# Patient Record
Sex: Female | Born: 1980
Health system: Southern US, Community
[De-identification: ages and names within clinical notes are randomized; demographics above are authoritative.]

## PROBLEM LIST (undated history)

## (undated) DIAGNOSIS — J9819 Other pulmonary collapse: Secondary | ICD-10-CM

## (undated) DIAGNOSIS — F419 Anxiety disorder, unspecified: Secondary | ICD-10-CM

## (undated) DIAGNOSIS — F410 Panic disorder [episodic paroxysmal anxiety] without agoraphobia: Secondary | ICD-10-CM

## (undated) DIAGNOSIS — T7840XA Allergy, unspecified, initial encounter: Secondary | ICD-10-CM

## (undated) DIAGNOSIS — R011 Cardiac murmur, unspecified: Secondary | ICD-10-CM

## (undated) DIAGNOSIS — I509 Heart failure, unspecified: Secondary | ICD-10-CM

## (undated) DIAGNOSIS — F329 Major depressive disorder, single episode, unspecified: Secondary | ICD-10-CM

## (undated) DIAGNOSIS — M419 Scoliosis, unspecified: Secondary | ICD-10-CM

## (undated) DIAGNOSIS — G709 Myoneural disorder, unspecified: Secondary | ICD-10-CM

## (undated) DIAGNOSIS — D649 Anemia, unspecified: Secondary | ICD-10-CM

## (undated) DIAGNOSIS — F32A Depression, unspecified: Secondary | ICD-10-CM

## (undated) DIAGNOSIS — IMO0002 Reserved for concepts with insufficient information to code with codable children: Secondary | ICD-10-CM

## (undated) HISTORY — DX: Heart failure, unspecified: I50.9

## (undated) HISTORY — DX: Allergy, unspecified, initial encounter: T78.40XA

## (undated) HISTORY — DX: Reserved for concepts with insufficient information to code with codable children: IMO0002

## (undated) HISTORY — DX: Cardiac murmur, unspecified: R01.1

## (undated) HISTORY — DX: Anemia, unspecified: D64.9

## (undated) HISTORY — DX: Panic disorder (episodic paroxysmal anxiety): F41.0

## (undated) HISTORY — DX: Major depressive disorder, single episode, unspecified: F32.9

## (undated) HISTORY — DX: Depression, unspecified: F32.A

## (undated) HISTORY — DX: Myoneural disorder, unspecified: G70.9

## (undated) HISTORY — DX: Scoliosis, unspecified: M41.9

## (undated) HISTORY — DX: Anxiety disorder, unspecified: F41.9

---

## 1998-11-09 ENCOUNTER — Other Ambulatory Visit: Admission: RE | Admit: 1998-11-09 | Discharge: 1998-11-09 | Payer: Self-pay | Admitting: Obstetrics and Gynecology

## 1998-12-16 ENCOUNTER — Inpatient Hospital Stay (HOSPITAL_COMMUNITY): Admission: AD | Admit: 1998-12-16 | Discharge: 1998-12-16 | Payer: Self-pay | Admitting: Obstetrics and Gynecology

## 1999-03-23 ENCOUNTER — Encounter: Payer: Self-pay | Admitting: Gynecology

## 1999-03-23 ENCOUNTER — Inpatient Hospital Stay (HOSPITAL_COMMUNITY): Admission: AD | Admit: 1999-03-23 | Discharge: 1999-03-25 | Payer: Self-pay | Admitting: Gynecology

## 1999-03-29 ENCOUNTER — Inpatient Hospital Stay (HOSPITAL_COMMUNITY): Admission: AD | Admit: 1999-03-29 | Discharge: 1999-04-02 | Payer: Self-pay | Admitting: Obstetrics and Gynecology

## 1999-04-17 ENCOUNTER — Inpatient Hospital Stay (HOSPITAL_COMMUNITY): Admission: AD | Admit: 1999-04-17 | Discharge: 1999-04-21 | Payer: Self-pay | Admitting: Gynecology

## 1999-05-02 ENCOUNTER — Inpatient Hospital Stay (HOSPITAL_COMMUNITY): Admission: AD | Admit: 1999-05-02 | Discharge: 1999-05-06 | Payer: Self-pay | Admitting: Gynecology

## 1999-05-03 ENCOUNTER — Encounter: Payer: Self-pay | Admitting: Gynecology

## 2000-01-24 ENCOUNTER — Other Ambulatory Visit: Admission: RE | Admit: 2000-01-24 | Discharge: 2000-01-24 | Payer: Self-pay | Admitting: Gynecology

## 2000-03-04 ENCOUNTER — Inpatient Hospital Stay (HOSPITAL_COMMUNITY): Admission: AD | Admit: 2000-03-04 | Discharge: 2000-03-04 | Payer: Self-pay | Admitting: Gynecology

## 2000-04-20 ENCOUNTER — Inpatient Hospital Stay (HOSPITAL_COMMUNITY): Admission: AD | Admit: 2000-04-20 | Discharge: 2000-04-20 | Payer: Self-pay | Admitting: *Deleted

## 2000-04-22 ENCOUNTER — Encounter (INDEPENDENT_AMBULATORY_CARE_PROVIDER_SITE_OTHER): Payer: Self-pay | Admitting: Specialist

## 2000-04-22 ENCOUNTER — Inpatient Hospital Stay (HOSPITAL_COMMUNITY): Admission: AD | Admit: 2000-04-22 | Discharge: 2000-04-25 | Payer: Self-pay | Admitting: Gynecology

## 2000-05-31 ENCOUNTER — Other Ambulatory Visit: Admission: RE | Admit: 2000-05-31 | Discharge: 2000-05-31 | Payer: Self-pay | Admitting: Gynecology

## 2000-06-04 ENCOUNTER — Inpatient Hospital Stay (HOSPITAL_COMMUNITY): Admission: AD | Admit: 2000-06-04 | Discharge: 2000-06-04 | Payer: Self-pay | Admitting: *Deleted

## 2000-07-16 ENCOUNTER — Encounter (INDEPENDENT_AMBULATORY_CARE_PROVIDER_SITE_OTHER): Payer: Self-pay

## 2000-07-16 ENCOUNTER — Other Ambulatory Visit: Admission: RE | Admit: 2000-07-16 | Discharge: 2000-07-16 | Payer: Self-pay | Admitting: Gynecology

## 2000-08-21 ENCOUNTER — Ambulatory Visit (HOSPITAL_COMMUNITY): Admission: RE | Admit: 2000-08-21 | Discharge: 2000-08-21 | Payer: Self-pay | Admitting: Gynecology

## 2000-12-04 ENCOUNTER — Other Ambulatory Visit: Admission: RE | Admit: 2000-12-04 | Discharge: 2000-12-04 | Payer: Self-pay | Admitting: Gynecology

## 2001-12-30 ENCOUNTER — Encounter: Payer: Self-pay | Admitting: Emergency Medicine

## 2001-12-30 ENCOUNTER — Emergency Department (HOSPITAL_COMMUNITY): Admission: EM | Admit: 2001-12-30 | Discharge: 2001-12-30 | Payer: Self-pay | Admitting: Emergency Medicine

## 2003-09-15 ENCOUNTER — Emergency Department (HOSPITAL_COMMUNITY): Admission: EM | Admit: 2003-09-15 | Discharge: 2003-09-16 | Payer: Self-pay | Admitting: Emergency Medicine

## 2003-09-16 ENCOUNTER — Encounter: Payer: Self-pay | Admitting: Emergency Medicine

## 2003-11-08 ENCOUNTER — Other Ambulatory Visit: Admission: RE | Admit: 2003-11-08 | Discharge: 2003-11-08 | Payer: Self-pay | Admitting: Obstetrics and Gynecology

## 2003-11-27 ENCOUNTER — Emergency Department (HOSPITAL_COMMUNITY): Admission: AD | Admit: 2003-11-27 | Discharge: 2003-11-27 | Payer: Self-pay | Admitting: Family Medicine

## 2006-02-04 ENCOUNTER — Emergency Department (HOSPITAL_COMMUNITY): Admission: EM | Admit: 2006-02-04 | Discharge: 2006-02-04 | Payer: Self-pay | Admitting: Emergency Medicine

## 2006-02-06 ENCOUNTER — Emergency Department (HOSPITAL_COMMUNITY): Admission: EM | Admit: 2006-02-06 | Discharge: 2006-02-06 | Payer: Self-pay | Admitting: Emergency Medicine

## 2007-01-05 ENCOUNTER — Inpatient Hospital Stay (HOSPITAL_COMMUNITY): Admission: AD | Admit: 2007-01-05 | Discharge: 2007-01-05 | Payer: Self-pay | Admitting: Obstetrics and Gynecology

## 2007-05-18 ENCOUNTER — Inpatient Hospital Stay (HOSPITAL_COMMUNITY): Admission: AD | Admit: 2007-05-18 | Discharge: 2007-05-21 | Payer: Self-pay | Admitting: Obstetrics and Gynecology

## 2008-04-12 ENCOUNTER — Emergency Department (HOSPITAL_COMMUNITY): Admission: EM | Admit: 2008-04-12 | Discharge: 2008-04-12 | Payer: Self-pay | Admitting: Family Medicine

## 2010-10-17 ENCOUNTER — Inpatient Hospital Stay (HOSPITAL_COMMUNITY): Admission: AD | Admit: 2010-10-17 | Discharge: 2010-10-17 | Payer: Self-pay | Admitting: Obstetrics & Gynecology

## 2011-03-14 LAB — URINALYSIS, ROUTINE W REFLEX MICROSCOPIC
Bilirubin Urine: NEGATIVE
Glucose, UA: NEGATIVE mg/dL
Ketones, ur: 15 mg/dL — AB
Nitrite: NEGATIVE
Protein, ur: NEGATIVE mg/dL
Specific Gravity, Urine: >1.03 — ABNORMAL HIGH (ref 1.005–1.030)
Urobilinogen, UA: 1 mg/dL (ref 0.0–1.0)
pH: 6 (ref 5.0–8.0)

## 2011-03-14 LAB — POCT PREGNANCY, URINE: Preg Test, Ur: NEGATIVE

## 2011-03-14 LAB — GC/CHLAMYDIA PROBE AMP, URINE
Chlamydia, Swab/Urine, PCR: NEGATIVE
GC Probe Amp, Urine: NEGATIVE

## 2011-03-14 LAB — URINE MICROSCOPIC-ADD ON

## 2011-05-15 NOTE — Op Note (Signed)
Suzanne Wade, Suzanne Wade              ACCOUNT NO.:  [id number]   MEDICAL RECORD NO.:  [id number]          PATIENT TYPE:  INP   LOCATION:  9128                          FACILITY:  [facility]   PHYSICIAN:  Genia Del, M.D.DATE OF BIRTH:  1981-07-11   DATE OF PROCEDURE:  05/18/2007  DATE OF DISCHARGE:                               OPERATIVE REPORT   PREOPERATIVE DIAGNOSIS:  38+ weeks gestation, active labor, two previous  C-section.   POSTOPERATIVE DIAGNOSIS:  38+ weeks gestation, active labor, two  previous C-section.   INTERVENTION:  Repeat urgent low transverse C-section.   SURGEON:  Dr. Genia Del   ANESTHESIOLOGIST:  Dr. Malen Gauze.   PROCEDURE:  Under spinal anesthesia the patient is in 15 degrees left  decubitus position.  She is prepped with Betadine on the abdominal,  suprapubic, vulvar and vaginal areas.  The bladder catheter is put in  place with a Foley. We draped the patient's as usual.  The level of  anesthesia is verified and is adequate.  We make a Pfannenstiel incision  at the site of the previous scar with the scalpel.  We opened the  aponeurosis transversely with Mayo scissors.  We detached the  aponeurosis from the recti muscles on the midline superiorly and  inferiorly.  We opened the parietal peritoneum longitudinally with [facility]  scissors.  We then put the Alexis rigid retractor in place.  We opened  the visceral peritoneum over the lower uterine segment with [facility]  scissors, reclined the bladder downward.  We then make a low transverse  hysterotomy over the lower uterine segment with a scalpel.  We extend on  each side with dressing scissors.  The fetus is in cephalic presentation  and the amniotic fluid is clear.  Birth of a baby girl at 69, the cord  is clamped and cut. The baby was suctioned and given to the neonatal  team.  Apgars are nine and nine. The placenta is evacuated spontaneously  and sent to L&D. Pitocin IV is started, a dose of Ancef 1 gram  IV is  given to the patient.  We then did the uterine revision.  We verify both  ovaries, both tubes which are normal to inspection.  The uterus is well  contracted.  No lesion at that level.  We close the hysterotomy in a  first running locked suture of chromic 0. We make a second layer with a  Vicryl 0 in a mattress fashion.  Hemostasis is adequate at all levels.  We therefore removed the retractor.  We irrigate and suction the  abdominopelvic cavity.  We complete hemostasis at the level of the recti  muscles with the electrocautery.  We closed the aponeurosis with two  half running sutures of Vicryl 0.  We complete hemostasis on the adipose  tissue with the electrocautery and reapproximate the skin with staples.  A dry dressing is applied on the incision.  The count of sponges and  instruments was complete x2.  The estimated blood loss was 500 mL.  No  complications occurred and the patient was brought to recovery room in  good  stable status.      Genia Del, M.D.  Electronically Signed     ML/MEDQ  D:  05/18/2007  T:  05/18/2007  Job:  [id number]

## 2011-05-18 NOTE — Discharge Summary (Signed)
Suzanne Wade, GOEBEL              ACCOUNT NO.:  [id number]   MEDICAL RECORD NO.:  [id number]          PATIENT TYPE:  INP   LOCATION:  9128                          [facility]ILITY:  [facility]   PHYSICIAN:  Genia Del, M.D.DATE OF BIRTH:  [birth year]   DATE OF ADMISSION:  05/18/2007  DATE OF DISCHARGE:  05/21/2007                               DISCHARGE SUMMARY   ADMISSION DIAGNOSIS:  Thirty-eight plus weeks' gestation, active labor,  2 previous c-sections.   DISCHARGE DIAGNOSIS:  Thirty-eight plus weeks' gestation, active labor,  2 previous c-sections.   PROCEDURE:  Repeat urgent low transverse C-section.   Birth of a baby girl, Apgars 9 and 9.  Estimated blood loss during the C-  section 500 mL.  No complication.  Postoperative evaluation was normal.  The patient had a postoperative hemoglobin of 10.6.  She was discharged  in normal stable status on postoperative day #3.  Postoperative advice  was given, and the patient was discharged home.  She will follow-up at  [facility] in 6 weeks.      Genia Del, M.D.  Electronically Signed     ML/MEDQ  D:  06/24/2007  T:  06/24/2007  Job:  [id number]

## 2011-05-18 NOTE — H&P (Signed)
[facility]  Patient:    Suzanne Wade, TRUXILLO                        MRN: [id number] Adm. Date:  [id number] Disc. Date: [id number] Attending:  Merrily Pew                         History and Physical  CHIEF COMPLAINT:              1. Active herpes.                               2. Active labor.                               3. Premature cervical dilatation.                               4. History of positive gallbladder surgery.  HISTORY:                      The patient is a 30 year old gravida 2, para 1, currently 36-5/[redacted] weeks gestation who had an active outbreak of herpes simplex virus on her left labia majora approximately two days ago.  She presented to [facility] this evening complaining of contractions which started at approximately 2100 hours.  She was placed in the monitor room and found to be contracting every four minutes with a reassured fetal heart tracing.  Her cervix was found to 6-7 cm dilated, 80% effaced, -3 station, with intact membranes.  PRENATAL COURSE:              Significant for the fact that she had iron deficiency anemia for which she was on supplemental iron and she also previous cesarean section in the past secondary to breech presentation and labor at 34-1/[redacted] weeks gestation.  PAST PREGNANCY:               She also had preterm labor in this pregnancy, also she had preterm labor on terbutaline 2.5 mg p.o. q.4h.  The patient also with history of scoliosis and recently was treated for urinary tract infection.  She also had low-grade squamous intraepithelial lesion during her pregnancy which will be followed postpartum.  ALLERGIES:                    The patient denies any allergy.  MEDICATIONS:                  1. Terbutaline 2.5 mg p.o. q.4h.                               2. Iron tablet.                               3. Prenatal vitamins.  PAST SURGERY:                 1. Previous cesarean section in May  2000.  2. Lower uterine segment transverse cesarean                                  section.  REVIEW OF SYSTEMS:            See Hollister form.  PHYSICAL EXAMINATION  GENERAL:                      Well-developed, well-nourished female.  HEENT:                        Unremarkable.  NECK:                         Supple.  Trachea in the midline.  No carotid bruits.  No thyromegaly.  LUNGS:                        Clear to auscultation without rhonchi or wheezes.  CARDIAC:                      Heart regular rate and rhythm.  No murmurs or gallops.  BREASTS:                      Not examined at this time.  During her first trimester, it was reported to be normal.  ABDOMEN:                      Gravid uterus.  Vertex presentation by Leopolds maneuver.  Positive fetal heart tones.  PELVIC:                       Cervix 6-7 cm dilated, 80% effaced, -3 station, intact membranes.  EXTREMITIES:                  DTRs 1+, negative clonus, trace edema.  PRENATAL LABORATORY DATA:     Blood type O positive, negative antibody screen. Sickle cell trait was negative.  VDRL was nonreactive.  Rubella immune. Hepatitis B surface antigen and HIV were negative.  Pap smear and _____ directed biopsy confirming CA__ 1.  Diabetes screen was normal. Positive GBS culture.  ASSESSMENT:                   This is a 30 year old gravida 2, para 1, at 36-5/[redacted] weeks gestation, 6-7 cm dilated, 80% effaced, -3 station, with intact membranes.  Active herpes simplex virus approximately since two days ago.  She was given terbutaline in Triage, 2.5 mg q 30 minutes apart to suppress her contractions and also due to positive group beta Streptococcus culture, she was given 5 milliunit of Phenergan IV, in preparation to be taken to the operating room for a repeat cesarean section due to active herpes and advanced cervical dilatation.  The risks, benefits, pros and cons of the procedure  were discussed with the patient  All questions were answered.  Will follow accordingly.  PLAN:                         Patient scheduled for repeat lower uterine segment transverse cesarean section.  NOTE:                         Hemoglobin  and hematocrit on admission were 9.5 and 29.2 respectively.  The platelet count is 219,000. DD:  04/22/00 TD:  04/22/00 Job: [id number]

## 2011-05-18 NOTE — Op Note (Signed)
[facility]  Patient:    Suzanne Wade, Suzanne Wade                        MRN: [id number] Proc. Date: 04/22/00 Attending:  Douglass Rivers                           Operative Report  SURGEON:                      Gaetano Hawthorne. Lily Peer, M.D.  INDICATION FOR OPERATION:     This is a 30 year old gravida 2, para 1 at 36-1/2  weeks estimated gestational age, in active labor with advanced cervical dilatation and active herpes simplex virus infection of the left labia majora and history f previous cesarean section.  PREOPERATIVE DIAGNOSES:       1. Premature cervical dilatation and in active labor                                  at 36-1/2 weeks estimated gestational age.                               2. Active herpes simplex virus of left labia majora.                               3. Previous cesarean section.  POSTOPERATIVE DIAGNOSES:      1. Premature cervical dilatation and in active labor                                  at 36-1/2 weeks estimated gestational age.                               2. Active herpes simplex virus of left labia majora.                               3. Previous cesarean section.  ANESTHESIA:                   Spinal.  PROCEDURE PERFORMED:          Repeat lower uterine segment transverse cesarean section.  FINDINGS:                     Viable female infant, Apgars of 8/9, weight 5 pounds 6 ounces, arterial cord pH 7.30.  Clear amniotic fluid.  Normal maternal tubes nd ovaries.  DESCRIPTION OF OPERATION:     After the patient was counseled, she was taken to the operating room where she underwent a successful spinal anesthesia placement. Her abdomen was prepped and draped in the usual sterile fashion when she was in the  supine position.  A Foley catheter was placed in an effort to monitor urinary output.  An escharotomy was performed by excising the previous Pfannenstiel scar, which was in a keloid-type formation, and was  excised and passed off the operative field.  The incision was then carried on from the subcutaneous tissue down to the fascia, whereby a midline nick was made and the fascia  was incised in a transverse fashion.  The midline raphe was entered, the peritoneal cavity was entered cautiously, the bladder flap was established and the lower uterine segment was incised in a transverse fashion.  Clear amniotic fluid was evident.  The newborns head was delivered and the nasopharyngeal area was bulb-suctioned.  The remainder of the baby was delivered without any complications, the cord was doubly clamped and excised and the newborn was shown to the parents and passed off to the pediatricians who were in attendance, who gave the above-mentioned parameters.  After cord blood was obtained, the placenta was delivered from the intrauterine  cavity and passed off for histological evaluation.  The uterus was then exteriorized.  The intrauterine cavity was swept clear of remaining products of  conception and the lower uterine segment incision was closed in a single-layer fashion with locking stitches of 0 Vicryl suture.  Both tubes and ovaries were inspected and appeared to be normal size, shape and caliber.  The uterus was placed back into the abdominal cavity.  The pelvic cavity was copiously irrigated with  normal saline solution.  After ascertaining adequate hemostasis, the rectus fascia was closed with a running stitch of 0 Vicryl suture.  The subcutaneous bleeders  were Bovie-cauterized and the skin was reapproximated with skin clips, followed by placement of Xeroform gauze and 4 x 8 dressing.  The patient was transferred to  recovery room with stable vital signs.  Blood loss was 700 cc.  IV fluid was 1550 cc of lactated Ringers.  She did receive 5 mU of penicillin G in triage in  preparation for a cesarean section and urine output was 220 cc and clear. There were no complications.DD:   04/22/00 TD:  04/23/00 Job: [id number]

## 2011-05-18 NOTE — Discharge Summary (Signed)
[facility]  Patient:    Suzanne Wade, Suzanne Wade                        MRN: [id number] Adm. Date:  [id number] Disc. Date: [id number] Attending:  Douglass Rivers Dictator:   Antony Contras, RNC, [facility]                           Discharge Summary  DISCHARGE DIAGNOSES:          1. Preterm premature dilatation at 36-1/2 weeks.                               2. Active herpes simplex virus.                               3. History of previous cesarean section.  HISTORY OF PRESENT ILLNESS:   The patient is a 30 year old, gravida 2, para 0-1-0-1, with an LMP of September 16, 1999, and a corrected [facility] of May 14, 2000, by ultrasound.  Risk factors include anemia, previous cesarean section.  Past history of preterm labor, HSV, scoliosis.  PRENATAL LABORATORY DATA:     Blood type O positive, antibody screen negative, rubella immune, RPR, HIV, HBSAG nonreactive, GBS negative.  HOSPITAL COURSE:              The patient was admitted on April 22, 2000, to maternity admissions unit with regular uterine contractions and cervical dilatation of 6 cm.  She also had an active HSV lesion on her left labia majora which had presented a couple of days previously. Due to these factors, she was taken to the operating room where a repeat cesarean section was performed under spinal anesthesia by Gaetano Hawthorne. Lily Peer, M.D.  She was delivered of an Apgars 8 and 9 female infant, weight 5 pounds 6 ounces.  Estimated blood loss was 700 cc.  Her  postoperative course she remained afebrile with no difficulty in voiding.  She as able to be discharged on her third postoperative day.  Postoperative CBC; hematocrit 27.8, hemoglobin 8.4, platelets 198, and white count 13.2.  DISPOSITION:                  Follow up in the office in six weeks.  Continue with prenatal vitamins, iron, Tylox, Motrin for pain. DD:  05/10/00 TD:  05/10/00 Job: [id number]

## 2011-09-25 LAB — POCT URINALYSIS DIP (DEVICE)
Nitrite: NEGATIVE
Operator id: 235561
Protein, ur: 100 — AB
pH: 6

## 2011-09-25 LAB — POCT PREGNANCY, URINE
Operator id: 2355561
Preg Test, Ur: NEGATIVE

## 2011-09-25 LAB — GC/CHLAMYDIA PROBE AMP, GENITAL: GC Probe Amp, Genital: NEGATIVE

## 2011-09-25 LAB — WET PREP, GENITAL: Trich, Wet Prep: NONE SEEN

## 2011-10-25 ENCOUNTER — Inpatient Hospital Stay (INDEPENDENT_AMBULATORY_CARE_PROVIDER_SITE_OTHER)
Admission: RE | Admit: 2011-10-25 | Discharge: 2011-10-25 | Disposition: A | Discharge status: Home / Self Care | Payer: Self-pay | Source: Ambulatory Visit | Attending: Family Medicine | Admitting: Family Medicine

## 2011-10-25 DIAGNOSIS — J029 Acute pharyngitis, unspecified: Secondary | ICD-10-CM

## 2011-10-25 LAB — POCT INFECTIOUS MONO SCREEN: Mono Screen: NEGATIVE

## 2011-10-25 LAB — POCT RAPID STREP A: Streptococcus, Group A Screen (Direct): NEGATIVE

## 2014-02-22 ENCOUNTER — Encounter: Payer: Self-pay | Admitting: Cardiology

## 2014-02-22 DIAGNOSIS — F410 Panic disorder [episodic paroxysmal anxiety] without agoraphobia: Secondary | ICD-10-CM | POA: Insufficient documentation

## 2014-02-22 DIAGNOSIS — R55 Syncope and collapse: Secondary | ICD-10-CM | POA: Insufficient documentation

## 2014-02-22 DIAGNOSIS — F411 Generalized anxiety disorder: Secondary | ICD-10-CM | POA: Insufficient documentation

## 2014-06-03 ENCOUNTER — Ambulatory Visit: Payer: BC Managed Care – PPO | Admitting: Family Medicine

## 2014-06-03 VITALS — BP 120/80 | HR 97 | Temp 98.4°F | Resp 18 | Ht 68.0 in | Wt 124.2 lb

## 2014-06-03 NOTE — Progress Notes (Signed)
Subjective:    Patient ID: Suzanne Wade, female    DOB: 06/17/1981, 33 y.o.   MRN: [id number] This chart was scribed for Ethelda ChickKristi M Mareo Portilla, MD by Valera CastleSteven Perry, ED Scribe. This patient was seen in room 03 and the patient's care was started at 8:06 PM.  06/03/2014  Workers comp  HPI [employer] Dell PontoBanks Wade is a 33 y.o. female Pt [facility] following a workman's comp injury.  Patient was advised by supervisor to undergo a drug screen somewhere tonight.    She states she fell down the stairs yesterday landing on her shoulder while at work. She denies pain in her shoulder this morning, but states as the day went on she has noticed increasing pain, swelling and redness to her shoulder. Deep breathing exacerbates her pain. She reports icing her shoulder while at work and took Aleve, without relief. She denies LOC, SOB, chest pain, and any other associated symptoms.   She states through communication with her employer, they would like to her to go ahead and start the worker's compensation process. She is employed by [employer]. She has a PE appointment tomorrow morning, but states they wanted her to try and squeeze in a visit tonight. She denies knowing for sure if her employer's are planning on paying for the charge of visit.   PCP - No PCP Per Patient  Review of Systems  Respiratory: Negative for shortness of breath.   Cardiovascular: Negative for chest pain.  Musculoskeletal: Positive for arthralgias (shoulder) and joint swelling.  Skin: Positive for color change (redness). Negative for wound.  Neurological: Negative for syncope.   Past Medical History  Diagnosis Date  . Anxiety     panic- was out on FMLA  . Anemia   . Depression    Allergies  Allergen Reactions  . Azithromycin Anaphylaxis   Current Outpatient Prescriptions  Medication Sig Dispense Refill  . esomeprazole (NEXIUM) 20 MG capsule Take 20 mg by mouth daily at 12 noon.       . ferrous fumarate (HEMOCYTE - 106 MG FE) 325 (106 FE) MG TABS tablet Take 1 tablet by mouth.       No current facility-administered medications for this visit.      Objective:    BP 120/80  Pulse 97  Temp(Src) 98.4 F (36.9 C) (Oral)  Resp 18  Ht 5\' 8"  (1.727 m)  Wt 124 lb 3.2 oz (56.337 kg)  BMI 18.89 kg/m2  SpO2 100%  LMP 05/26/2014 Physical Exam  Nursing note and vitals reviewed. Constitutional: She is oriented to person, place, and time. She appears well-developed and well-nourished. No distress.  HENT:  Head: Normocephalic and atraumatic.  Eyes: Conjunctivae and EOM are normal.  Neck: Normal range of motion. No tracheal deviation present.  Cardiovascular: Normal rate.   Pulmonary/Chest: Effort normal. No respiratory distress.  Neurological: She is alert and oriented to person, place, and time.  Skin: Skin is warm and dry.  Psychiatric: She has a normal mood and affect. Her behavior is normal.       Assessment & Plan:  Left shoulder pain  Thoracic back pain   1. L shoulder pain/L thoracic back pain:  New.  Workman's compensation injury; advised patient to follow-up with employer regarding appropriate protocol to follow for workman's compensation injury with drug screen.  No orders of the defined types were placed in this encounter.    No Follow-up on file.  I personally performed the services  described in this documentation, which was scribed in my presence.  The recorded information has been reviewed and is accurate.  Nilda Simmer, M.D.  [facility] [address] [place], [place]  [address] [phone] phone [phone] fax

## 2014-06-04 ENCOUNTER — Ambulatory Visit (INDEPENDENT_AMBULATORY_CARE_PROVIDER_SITE_OTHER): Payer: BC Managed Care – PPO | Admitting: Family Medicine

## 2014-06-04 ENCOUNTER — Encounter: Payer: Self-pay | Admitting: Family Medicine

## 2014-06-04 VITALS — BP 110/70 | HR 77 | Temp 98.4°F | Resp 16 | Ht 68.5 in | Wt 123.2 lb

## 2014-06-04 DIAGNOSIS — R635 Abnormal weight gain: Secondary | ICD-10-CM

## 2014-06-04 DIAGNOSIS — Z13220 Encounter for screening for lipoid disorders: Secondary | ICD-10-CM

## 2014-06-04 DIAGNOSIS — Z0000 Encounter for general adult medical examination without abnormal findings: Secondary | ICD-10-CM

## 2014-06-04 LAB — POCT URINALYSIS DIPSTICK
Bilirubin, UA: NEGATIVE
Blood, UA: NEGATIVE
Glucose, UA: NEGATIVE
Ketones, UA: NEGATIVE
Leukocytes, UA: NEGATIVE
Nitrite, UA: NEGATIVE
Protein, UA: NEGATIVE
Spec Grav, UA: 1.01
Urobilinogen, UA: 0.2
pH, UA: 6

## 2014-06-04 LAB — CBC
HCT: 40.1 % (ref 36.0–46.0)
Hemoglobin: 13 g/dL (ref 12.0–15.0)
MCH: 27.7 pg (ref 26.0–34.0)
MCHC: 32.4 g/dL (ref 30.0–36.0)
MCV: 85.5 fL (ref 78.0–100.0)
Platelets: 223 10*3/uL (ref 150–400)
RBC: 4.69 MIL/uL (ref 3.87–5.11)
RDW: 14 % (ref 11.5–15.5)
WBC: 7.2 10*3/uL (ref 4.0–10.5)

## 2014-06-04 LAB — COMPREHENSIVE METABOLIC PANEL
ALT: 16 U/L (ref 0–35)
AST: 21 U/L (ref 0–37)
Alkaline Phosphatase: 51 U/L (ref 39–117)
CO2: 25 mEq/L (ref 19–32)
Creat: 0.67 mg/dL (ref 0.50–1.10)
Sodium: 136 mEq/L (ref 135–145)
Total Bilirubin: 0.9 mg/dL (ref 0.2–1.2)
Total Protein: 7.3 g/dL (ref 6.0–8.3)

## 2014-06-04 LAB — COMPREHENSIVE METABOLIC PANEL WITH GFR
Albumin: 4.5 g/dL (ref 3.5–5.2)
BUN: 9 mg/dL (ref 6–23)
Calcium: 9.6 mg/dL (ref 8.4–10.5)
Chloride: 104 meq/L (ref 96–112)
Glucose, Bld: 79 mg/dL (ref 70–99)
Potassium: 4.1 meq/L (ref 3.5–5.3)

## 2014-06-04 LAB — POCT UA - MICROSCOPIC ONLY
Casts, Ur, LPF, POC: NEGATIVE
Crystals, Ur, HPF, POC: NEGATIVE
Epithelial cells, urine per micros: NEGATIVE
Mucus, UA: NEGATIVE
Yeast, UA: NEGATIVE

## 2014-06-04 LAB — LIPID PANEL
Cholesterol: 88 mg/dL (ref 0–200)
HDL: 51 mg/dL (ref >39)
LDL Cholesterol: 25 mg/dL (ref 0–99)
Total CHOL/HDL Ratio: 1.7 ratio
Triglycerides: 58 mg/dL (ref <150)
VLDL: 12 mg/dL (ref 0–40)

## 2014-06-04 NOTE — Progress Notes (Signed)
Chief Complaint:  Chief Complaint  Patient presents with  . Annual Exam    HPI: Suzanne Wade is a 33 y.o. female who is here for  Annual without pap or breast exam She is G5A miscc 1, spont abort 2, L3 She has 7 children totoal, 3 biological children ages  115, 4514 and 7 Last annual was 2 years ago, abnormal PAP , Dr cousins is her ob/gyn, next appt is 1 week, has not followed up on abnormal pap Has a history of CHF, heart murmur, saw cards Dr Rockne MenghiniMark Scains at [facility] and that was what he told her when they did echocardiogram HAs a psyhciatrist, Valente DavidMark Snyder but he [facility], she has not been to see anyone for counseling, was put on Mirtazapine and did not do well , made her feel like a zombie  Past Medical History  Diagnosis Date  . Allergy   . Anemia   . Anxiety   . CHF (congestive heart failure)   . Depression   . Heart murmur   . Neuromuscular disorder   . Ulcer   . Scoliosis   . Panic attacks    History reviewed. No pertinent past surgical history. History   Social History  . Marital Status: Married    Spouse Name: N/A    Number of Children: N/A  . Years of Education: N/A   Occupational History  . Life ins/ Mortgage    Social History Main Topics  . Smoking status: Former Games developermoker  . Smokeless tobacco: None  . Alcohol Use: Yes     Comment: 2 drinks  . Drug Use: No  . Sexual Activity: None   Other Topics Concern  . None   Social History Narrative   Married. Education: [employer]. Exercise: yes.   Family History  Problem Relation Age of Onset  . Hypertension Mother   . Heart disease Sister   . Hyperlipidemia Sister   . Hypertension Sister   . Cancer Sister     brain  . Cancer Maternal Grandmother     uterine  . Heart disease Maternal Grandmother   . Hyperlipidemia Maternal Grandmother   . Hypertension Maternal Grandmother   . Stroke Maternal Grandmother   . Cancer Maternal Grandfather     lung  . Heart disease Maternal Grandfather   .  Hyperlipidemia Maternal Grandfather   . Hypertension Maternal Grandfather   . Stroke Maternal Grandfather    Allergies  Allergen Reactions  . Nickel     Breaks out, red rash  . Z-Pak [Azithromycin]     Closed throat up   Prior to Admission medications   Not on File     ROS: The patient denies fevers, chills, night sweats, unintentional weight loss, chest pain, palpitations, wheezing, dyspnea on exertion, nausea, vomiting, abdominal pain, dysuria, hematuria, melena, numbness, weakness, or tingling.   All other systems have been reviewed and were otherwise negative with the exception of those mentioned in the HPI and as above.    PHYSICAL EXAM: Filed Vitals:   06/04/14 1005  BP: 110/70  Pulse: 77  Temp: 98.4 F (36.9 C)  Resp: 16   Filed Vitals:   06/04/14 1005  Height: 5' 8.5" (1.74 m)  Weight: 123 lb 3.2 oz (55.883 kg)   Body mass index is 18.46 kg/(m^2).  General: Alert, no acute distress HEENT:  Normocephalic, atraumatic, oropharynx patent. EOMI, PERRLA, TM nl, no thyroidmegaly Cardiovascular:  Regular rate and rhythm, no rubs murmurs or gallops.  No Carotid bruits, radial pulse intact. No pedal edema.  Respiratory: Clear to auscultation bilaterally.  No wheezes, rales, or rhonchi.  No cyanosis, no use of accessory musculature GI: No organomegaly, abdomen is soft and non-tender, positive bowel sounds.  No masses. Skin: No rashes. + port wine stain facial birth mark Neurologic: Facial musculature symmetric. Psychiatric: Patient is appropriate throughout our interaction. Lymphatic: No cervical lymphadenopathy Musculoskeletal: Gait intact. Minimal tenderness on left shoulder   LABS: No results found for this or any previous visit.   EKG/XRAY:   Primary read interpreted by Dr. Conley Rolls at [facility].   ASSESSMENT/PLAN: Encounter Diagnoses  Name Primary?  . Visit for annual health examination Yes  . Screening for hyperlipidemia   . Weight gain    Labs pending Breast  and GU exam defer for Dr Cherly Hensen Will ask patient to get records from [facility] cardiology, I did nto hear murmur She has tenderness on left shoulder s/p fall at work, will need f.u for work comp for that.  F/u prn  Gross sideeffects, risk and benefits, and alternatives of medications d/w patient. Patient is aware that all medications have potential sideeffects and we are unable to predict every sideeffect or drug-drug interaction that may occur.  Jalexus Brett P Shaquanda Graves, DO 06/04/2014 11:10 AM

## 2014-06-05 LAB — TSH: TSH: 1.107 u[IU]/mL (ref 0.350–4.500)

## 2014-06-23 ENCOUNTER — Encounter: Payer: Self-pay | Admitting: Family Medicine

## 2015-04-16 ENCOUNTER — Ambulatory Visit (INDEPENDENT_AMBULATORY_CARE_PROVIDER_SITE_OTHER): Payer: BLUE CROSS/BLUE SHIELD | Admitting: Internal Medicine

## 2015-04-16 ENCOUNTER — Ambulatory Visit (INDEPENDENT_AMBULATORY_CARE_PROVIDER_SITE_OTHER): Payer: BLUE CROSS/BLUE SHIELD

## 2015-04-16 VITALS — BP 100/60 | HR 70 | Temp 97.6°F | Resp 16 | Ht 69.0 in | Wt 116.0 lb

## 2015-04-16 DIAGNOSIS — M546 Pain in thoracic spine: Secondary | ICD-10-CM | POA: Diagnosis not present

## 2015-04-16 DIAGNOSIS — M545 Low back pain, unspecified: Secondary | ICD-10-CM

## 2015-04-16 LAB — POCT URINALYSIS DIPSTICK
BILIRUBIN UA: NEGATIVE
Blood, UA: NEGATIVE
Glucose, UA: NEGATIVE
Ketones, UA: NEGATIVE
LEUKOCYTES UA: NEGATIVE
Nitrite, UA: NEGATIVE
PH UA: 6
PROTEIN UA: NEGATIVE
SPEC GRAV UA: 1.025
Urobilinogen, UA: 1

## 2015-04-16 LAB — POCT UA - MICROSCOPIC ONLY
CASTS, UR, LPF, POC: NEGATIVE
Crystals, Ur, HPF, POC: NEGATIVE
Yeast, UA: NEGATIVE

## 2015-04-16 MED ORDER — MELOXICAM 15 MG PO TABS: 15.0000 mg | ORAL_TABLET | Freq: Every day | ORAL | Status: DC

## 2015-04-16 MED ORDER — CYCLOBENZAPRINE HCL 10 MG PO TABS: 10.0000 mg | ORAL_TABLET | Freq: Every day | ORAL | Status: DC

## 2015-04-16 NOTE — Progress Notes (Addendum)
Subjective:  This chart was scribed for Ellamae Sia MD,  by Veverly Fells, at [facility].  This patient was seen in room 11 and the patient's care was started at 9:44 AM.    Patient ID: Suzanne Wade, female    DOB: Jun 26, 1981, 34 y.o.   MRN: [id number]  HPI  HPI Comments: Lian Pounds is a 34 y.o. female who presents to [facility] for sharp stabbing/radiating intermittent back pain onset 2 weeks ago.  Her back pain radiates down to her right leg at times and feels that it tenses up.  She is unable to move due to the pain when her back pain episodes occur at these random times.  She has associated symptoms of difficulty breathing deep which causes her pain to radiate to her right back.  She does not think it is not associated with her mesntrual cycle.  She has a history of scoliosis (since she was a teenager) with a 10 degree curvature.   She denies coughing, nausea, vomitting, frequency, dysuria, hematuria, any recent injury/falls or history of kidney stones. She had a dental problem with a tooth extraction at the end of March.   Migraines: She has slight migraine issues due to tooth aches and has fluctuations with her weight. She has been staying up a lot later due to stress and has been going to bed later than usual. Stressed by her son who has juvenile justice problems in by her work with geriatrics and pharmaceuticals.  Patient Active Problem List   Diagnosis Date Noted   Syncope and collapse-----followed by cardiology  02/22/2014   Anxiety state, unspecified-----stress associated with no current medications needed  02/22/2014   Current Outpatient Prescriptions on File Prior to Visit  Medication Sig Dispense Refill   ferrous fumarate (HEMOCYTE - 106 MG FE) 325 (106 FE) MG TABS tablet Take 1 tablet by mouth.     esomeprazole (NEXIUM) 20 MG capsule Take 20 mg by mouth daily at 12 noon.     No current  facility-administered medications on file prior to visit.   Allergies  Allergen Reactions   Azithromycin Anaphylaxis   Nickel     Breaks out, red rash   Z-Pak [Azithromycin]     Closed throat up    Review of Systems  Constitutional: Negative for fever and chills.  HENT: Negative for drooling, nosebleeds and sneezing.   Eyes: Negative for redness.  Gastrointestinal: Negative for vomiting.  Musculoskeletal: Positive for back pain.      Objective:   Physical Exam  Constitutional: She is oriented to person, place, and time. She appears well-developed and well-nourished. No distress.  HENT:  Head: Normocephalic and atraumatic.  Eyes: Conjunctivae and EOM are normal. Pupils are equal, round, and reactive to light.  Neck: Neck supple.  Cardiovascular: Normal rate and regular rhythm.   No murmur heard. She has a mid systolic click   Pulmonary/Chest: Effort normal and breath sounds normal. No respiratory distress. She has no wheezes. She has no rales.  Musculoskeletal: Normal range of motion.  She has significant scoliosis to the right.  Tender to palpation along the lower right rib margain posteriorly to the axillary area.  Tender to palpation over the right lumbar area. Pain with rotation and tilting.  Negative straight leg raise.    Neurological: She is alert and oriented to person, place, and time. She has normal reflexes.  Skin: Skin is warm and dry.  Psychiatric: She has  a normal mood and affect. Her behavior is normal.  Nursing note and vitals reviewed.   BP 100/60 mmHg   Pulse 70   Temp(Src) 97.6 F (36.4 C) (Oral)   Resp 16   Ht 5\' 9"  (1.753 m)   Wt 116 lb (52.617 kg)   BMI 17.12 kg/m2   SpO2 98%   LMP 04/07/2015  [facility] reading (PRIMARY) by  Dr.Doolittle= marked thoracic lumbar scoliosis with no acute bony changes  Results for orders placed or performed in visit on 04/16/15  POCT urinalysis dipstick  Result Value Ref Range   Color, UA yellow    Clarity, UA hazy     Glucose, UA neg    Bilirubin, UA neg    Ketones, UA neg    Spec Grav, UA 1.025    Blood, UA neg    pH, UA 6.0    Protein, UA neg    Urobilinogen, UA 1.0    Nitrite, UA neg    Leukocytes, UA Negative   POCT UA - Microscopic Only  Result Value Ref Range   WBC, Ur, HPF, POC 0-4    RBC, urine, microscopic 0-2    Bacteria, U Microscopic 2+    Mucus, UA moderate    Epithelial cells, urine per micros 10-tntc    Crystals, Ur, HPF, POC neg    Casts, Ur, LPF, POC neg    Yeast, UA neg         Assessment & Plan:  I have completed the patient encounter in its entirety as documented by the scribe, with editing by me where necessary. Robert P. Merla Richesoolittle, M.D.  Right-sided thoracic back pain - Plan: POCT urinalysis dipstick, DG Thoracic Spine 2 View, POCT UA - Microscopic Only  Right-sided low back pain without sciatica - Plan: DG Lumbar Spine 2-3 Views  This all seems musculoskeletal in origin and she will be treated with Flexeril and meloxicam as well as physical therapy She has a chiropractor that she sees on a regular basis Meds ordered this encounter  Medications   meloxicam (MOBIC) 15 MG tablet    Sig: Take 1 tablet (15 mg total) by mouth daily.    Dispense:  30 tablet    Refill:  0   cyclobenzaprine (FLEXERIL) 10 MG tablet    Sig: Take 1 tablet (10 mg total) by mouth at bedtime. Muscle relaxer    Dispense:  30 tablet    Refill:  0   Follow-up 2-4 weeks if not responding

## 2015-04-18 ENCOUNTER — Encounter: Payer: Self-pay | Admitting: Internal Medicine

## 2015-04-27 ENCOUNTER — Ambulatory Visit: Payer: Self-pay | Admitting: Physician Assistant

## 2015-10-27 ENCOUNTER — Encounter: Payer: Self-pay | Admitting: Emergency Medicine

## 2015-10-27 ENCOUNTER — Ambulatory Visit (INDEPENDENT_AMBULATORY_CARE_PROVIDER_SITE_OTHER): Payer: BLUE CROSS/BLUE SHIELD | Admitting: Emergency Medicine

## 2015-10-27 VITALS — BP 110/70 | HR 75 | Temp 97.7°F | Resp 18 | Ht 68.0 in | Wt 117.0 lb

## 2015-10-27 DIAGNOSIS — Z0000 Encounter for general adult medical examination without abnormal findings: Secondary | ICD-10-CM | POA: Diagnosis not present

## 2015-10-27 DIAGNOSIS — N3090 Cystitis, unspecified without hematuria: Secondary | ICD-10-CM

## 2015-10-27 LAB — POC MICROSCOPIC URINALYSIS (UMFC)

## 2015-10-27 LAB — POCT URINALYSIS DIP (MANUAL ENTRY)
Bilirubin, UA: NEGATIVE
GLUCOSE UA: NEGATIVE
LEUKOCYTES UA: NEGATIVE
Nitrite, UA: POSITIVE — AB
Protein Ur, POC: NEGATIVE
SPEC GRAV UA: 1.025
Urobilinogen, UA: 1
pH, UA: 6

## 2015-10-27 NOTE — Progress Notes (Addendum)
Subjective:  Patient ID: Normajean BaxterLatisha Banks Gatlin, female    DOB: 01/20/1981  Age: 34 y.o. MRN: [id number]  CC: Annual Exam   HPI Normajean BaxterLatisha Banks Macintyre presents  For annual exam.  No meds or health concerns.  Refuses flu shot.  History Debbe OdeaLatisha has a past medical history of Anxiety; Allergy; Anemia; Anxiety; CHF (congestive heart failure) (HCC); Depression; Heart murmur; Neuromuscular disorder (HCC); Ulcer; Scoliosis; and Panic attacks.   She has past surgical history that includes Cesarean section.   Her  family history includes Cancer in her maternal grandfather, maternal grandmother, and sister; Heart disease in her maternal grandfather, maternal grandmother, and sister; Hyperlipidemia in her maternal grandfather, maternal grandmother, and sister; Hypertension in her maternal grandfather, maternal grandmother, mother, and sister; Stroke in her maternal grandfather and maternal grandmother.  She   reports that she has quit smoking. She does not have any smokeless tobacco history on file. She reports that she drinks alcohol. She reports that she does not use illicit drugs.  Outpatient Prescriptions Prior to Visit  Medication Sig Dispense Refill  . ferrous fumarate (HEMOCYTE - 106 MG FE) 325 (106 FE) MG TABS tablet Take 1 tablet by mouth.    . cyclobenzaprine (FLEXERIL) 10 MG tablet Take 1 tablet (10 mg total) by mouth at bedtime. Muscle relaxer (Patient not taking: Reported on 10/27/2015) 30 tablet 0  . esomeprazole (NEXIUM) 20 MG capsule Take 20 mg by mouth daily at 12 noon.    . meloxicam (MOBIC) 15 MG tablet Take 1 tablet (15 mg total) by mouth daily. (Patient not taking: Reported on 10/27/2015) 30 tablet 0   No facility-administered medications prior to visit.    Social History   Social History  . Marital Status: Married    Spouse Name: N/A  . Number of Children: N/A  . Years of Education: N/A   Occupational History  . Life ins/ Mortgage    Social History Main Topics  .  Smoking status: Former Games developermoker  . Smokeless tobacco: None  . Alcohol Use: Yes     Comment: 2 drinks  . Drug Use: No  . Sexual Activity: Not Asked   Other Topics Concern  . None   Social History Narrative   ** Merged History Encounter **       ** Data from: 06/03/14 Enc Dept: [facility]       ** Data from: 06/04/14 Enc Dept: [facility]   Married. Education: [employer]. Exercise: yes.     Review of Systems  Constitutional: Negative for fever, chills and appetite change.  HENT: Negative for congestion, ear pain, postnasal drip, sinus pressure and sore throat.   Eyes: Negative for pain and redness.  Respiratory: Negative for cough, shortness of breath and wheezing.   Cardiovascular: Negative for leg swelling.  Gastrointestinal: Negative for nausea, vomiting, abdominal pain, diarrhea, constipation and blood in stool.  Endocrine: Negative for polyuria.  Genitourinary: Negative for dysuria, urgency, frequency and flank pain.  Musculoskeletal: Negative for gait problem.  Skin: Negative for rash.  Neurological: Negative for weakness and headaches.  Psychiatric/Behavioral: Negative for confusion and decreased concentration. The patient is not nervous/anxious.     Objective:  BP 110/70 mmHg  Pulse 75  Temp(Src) 97.7 F (36.5 C) (Oral)  Resp 18  Ht 5\' 8"  (1.727 m)  Wt 117 lb (53.071 kg)  BMI 17.79 kg/m2  SpO2 98%  LMP 10/26/2015 (Exact Date)  Physical Exam  Constitutional: She is oriented to person, place, and  time. She appears well-developed and well-nourished. No distress.  HENT:  Head: Normocephalic and atraumatic.  Right Ear: External ear normal.  Left Ear: External ear normal.  Nose: Nose normal.  Eyes: Conjunctivae and EOM are normal. Pupils are equal, round, and reactive to light. No scleral icterus.  Neck: Normal range of motion. Neck supple. No tracheal deviation present.  Cardiovascular: Normal rate, regular rhythm and normal heart sounds.     Pulmonary/Chest: Effort normal. No respiratory distress. She has no wheezes. She has no rales.  Abdominal: She exhibits no mass. There is no tenderness. There is no rebound and no guarding.  Musculoskeletal: She exhibits no edema.  Lymphadenopathy:    She has no cervical adenopathy.  Neurological: She is alert and oriented to person, place, and time. Coordination normal.  Skin: Skin is warm and dry. No rash noted.  Psychiatric: She has a normal mood and affect. Her behavior is normal.      Assessment & Plan:   Maressa was seen today for annual exam.  Diagnoses and all orders for this visit:  Annual physical exam -     CBC; Future -     Comprehensive metabolic panel; Future -     Lipid panel; Future -     POCT urinalysis dipstick -     POCT Microscopic Urinalysis ([facility])   I am having Ms. Isaacson maintain her ferrous fumarate, esomeprazole, meloxicam, cyclobenzaprine, BIOTIN PO, Cyanocobalamin (B-12 PO), multivitamin, and prenatal multivitamin.  Meds ordered this encounter  Medications  . BIOTIN PO    Sig: Take by mouth.  . Cyanocobalamin (B-12 PO)    Sig: Take by mouth.  . Multiple Vitamin (MULTIVITAMIN) tablet    Sig: Take 1 tablet by mouth daily.  . Prenatal Vit-Fe Fumarate-FA (PRENATAL MULTIVITAMIN) TABS tablet    Sig: Take 1 tablet by mouth daily at 12 noon.    Appropriate red flag conditions were discussed with the patient as well as actions that should be taken.  Patient expressed his understanding.  Follow-up: Return if symptoms worsen or fail to improve.  Carmelina Dane, MD   Results for orders placed or performed in visit on 10/27/15  POCT urinalysis dipstick  Result Value Ref Range   Color, UA yellow yellow   Clarity, UA cloudy (A) clear   Glucose, UA negative negative   Bilirubin, UA negative negative   Ketones, POC UA trace (5) (A) negative   Spec Grav, UA 1.025    Blood, UA large (A) negative   pH, UA 6.0    Protein Ur, POC negative  negative   Urobilinogen, UA 1.0    Nitrite, UA Positive (A) Negative   Leukocytes, UA Negative Negative  POCT Microscopic Urinalysis ([facility])  Result Value Ref Range   WBC,UR,HPF,POC Moderate (A) None WBC/hpf   RBC,UR,HPF,POC Moderate (A) None RBC/hpf   Bacteria Moderate (A) None, Too numerous to count   Mucus Present (A) Absent   Epithelial Cells, UR Per Microscopy Many (A) None, Too numerous to count cells/hpf

## 2015-10-28 MED ORDER — CIPROFLOXACIN HCL 500 MG PO TABS: 500.0000 mg | ORAL_TABLET | Freq: Two times a day (BID) | ORAL | Status: DC

## 2015-10-28 NOTE — Addendum Note (Signed)
Addended by: Carmelina DaneANDERSON, Jakel Alphin S on: 10/28/2015 08:49 AM   Modules accepted: Orders, SmartSet

## 2015-11-11 ENCOUNTER — Other Ambulatory Visit (INDEPENDENT_AMBULATORY_CARE_PROVIDER_SITE_OTHER): Payer: BLUE CROSS/BLUE SHIELD

## 2015-11-11 DIAGNOSIS — Z0000 Encounter for general adult medical examination without abnormal findings: Secondary | ICD-10-CM

## 2015-11-11 LAB — CBC
HCT: 39.6 % (ref 36.0–46.0)
Hemoglobin: 13.2 g/dL (ref 12.0–15.0)
MCH: 28.7 pg (ref 26.0–34.0)
MCHC: 33.3 g/dL (ref 30.0–36.0)
MCV: 86.1 fL (ref 78.0–100.0)
MPV: 10.5 fL (ref 8.6–12.4)
Platelets: 177 10*3/uL (ref 150–400)
RBC: 4.6 MIL/uL (ref 3.87–5.11)
RDW: 14.2 % (ref 11.5–15.5)
WBC: 6.9 10*3/uL (ref 4.0–10.5)

## 2015-11-11 LAB — COMPREHENSIVE METABOLIC PANEL
ALBUMIN: 4.5 g/dL (ref 3.6–5.1)
ALK PHOS: 46 U/L (ref 33–115)
ALT: 17 U/L (ref 6–29)
AST: 24 U/L (ref 10–30)
BILIRUBIN TOTAL: 1.4 mg/dL — AB (ref 0.2–1.2)
BUN: 11 mg/dL (ref 7–25)
CALCIUM: 9.7 mg/dL (ref 8.6–10.2)
CO2: 20 mmol/L (ref 20–31)
CREATININE: 0.77 mg/dL (ref 0.50–1.10)
Chloride: 104 mmol/L (ref 98–110)
Glucose, Bld: 76 mg/dL (ref 65–99)
Potassium: 4.2 mmol/L (ref 3.5–5.3)
SODIUM: 138 mmol/L (ref 135–146)
Total Protein: 7.4 g/dL (ref 6.1–8.1)

## 2015-11-11 LAB — LIPID PANEL
Cholesterol: 106 mg/dL — ABNORMAL LOW (ref 125–200)
HDL: 57 mg/dL (ref >=46)
LDL CALC: 34 mg/dL (ref <130)
TRIGLYCERIDES: 74 mg/dL (ref <150)
Total CHOL/HDL Ratio: 1.9 Ratio (ref <=5.0)
VLDL: 15 mg/dL (ref <30)

## 2016-03-02 ENCOUNTER — Other Ambulatory Visit: Payer: Self-pay | Admitting: *Deleted

## 2016-03-02 ENCOUNTER — Emergency Department (HOSPITAL_COMMUNITY)
Admission: EM | Admit: 2016-03-02 | Discharge: 2016-03-02 | Disposition: A | Discharge status: Home / Self Care | Payer: BLUE CROSS/BLUE SHIELD | Attending: Emergency Medicine | Admitting: Emergency Medicine

## 2016-03-02 ENCOUNTER — Ambulatory Visit (INDEPENDENT_AMBULATORY_CARE_PROVIDER_SITE_OTHER): Payer: BLUE CROSS/BLUE SHIELD

## 2016-03-02 ENCOUNTER — Ambulatory Visit (INDEPENDENT_AMBULATORY_CARE_PROVIDER_SITE_OTHER): Payer: BLUE CROSS/BLUE SHIELD | Admitting: Emergency Medicine

## 2016-03-02 ENCOUNTER — Encounter (HOSPITAL_COMMUNITY): Payer: Self-pay

## 2016-03-02 VITALS — BP 104/60 | HR 76 | Temp 98.0°F | Resp 16 | Ht 67.0 in | Wt 119.0 lb

## 2016-03-02 DIAGNOSIS — M419 Scoliosis, unspecified: Secondary | ICD-10-CM | POA: Diagnosis not present

## 2016-03-02 DIAGNOSIS — J9311 Primary spontaneous pneumothorax: Secondary | ICD-10-CM

## 2016-03-02 DIAGNOSIS — R011 Cardiac murmur, unspecified: Secondary | ICD-10-CM | POA: Diagnosis not present

## 2016-03-02 DIAGNOSIS — Z8659 Personal history of other mental and behavioral disorders: Secondary | ICD-10-CM | POA: Insufficient documentation

## 2016-03-02 DIAGNOSIS — R079 Chest pain, unspecified: Secondary | ICD-10-CM | POA: Diagnosis present

## 2016-03-02 DIAGNOSIS — Z87891 Personal history of nicotine dependence: Secondary | ICD-10-CM | POA: Diagnosis not present

## 2016-03-02 DIAGNOSIS — Z79899 Other long term (current) drug therapy: Secondary | ICD-10-CM | POA: Insufficient documentation

## 2016-03-02 DIAGNOSIS — D649 Anemia, unspecified: Secondary | ICD-10-CM | POA: Insufficient documentation

## 2016-03-02 DIAGNOSIS — I509 Heart failure, unspecified: Secondary | ICD-10-CM | POA: Insufficient documentation

## 2016-03-02 DIAGNOSIS — J939 Pneumothorax, unspecified: Secondary | ICD-10-CM

## 2016-03-02 DIAGNOSIS — J9383 Other pneumothorax: Secondary | ICD-10-CM

## 2016-03-02 DIAGNOSIS — Z8669 Personal history of other diseases of the nervous system and sense organs: Secondary | ICD-10-CM | POA: Insufficient documentation

## 2016-03-02 LAB — CBC WITH DIFFERENTIAL/PLATELET
Basophils Absolute: 0 10*3/uL (ref 0.0–0.1)
Basophils Relative: 0 %
EOS ABS: 0.1 10*3/uL (ref 0.0–0.7)
EOS PCT: 1 %
HCT: 45.8 % (ref 36.0–46.0)
Hemoglobin: 14.7 g/dL (ref 12.0–15.0)
LYMPHS ABS: 1.7 10*3/uL (ref 0.7–4.0)
Lymphocytes Relative: 24 %
MCH: 28.3 pg (ref 26.0–34.0)
MCHC: 32.1 g/dL (ref 30.0–36.0)
MCV: 88.2 fL (ref 78.0–100.0)
MONO ABS: 0.5 10*3/uL (ref 0.1–1.0)
Monocytes Relative: 7 %
Neutro Abs: 4.8 10*3/uL (ref 1.7–7.7)
Neutrophils Relative %: 68 %
PLATELETS: 199 10*3/uL (ref 150–400)
RBC: 5.19 MIL/uL — AB (ref 3.87–5.11)
RDW: 13.8 % (ref 11.5–15.5)
WBC: 7 10*3/uL (ref 4.0–10.5)

## 2016-03-02 LAB — BASIC METABOLIC PANEL
Anion gap: 7 (ref 5–15)
BUN: 8 mg/dL (ref 6–20)
CHLORIDE: 106 mmol/L (ref 101–111)
CO2: 26 mmol/L (ref 22–32)
CREATININE: 0.78 mg/dL (ref 0.44–1.00)
Calcium: 10.4 mg/dL — ABNORMAL HIGH (ref 8.9–10.3)
GFR calc non Af Amer: >60 mL/min (ref >60)
Glucose, Bld: 96 mg/dL (ref 65–99)
Potassium: 4.4 mmol/L (ref 3.5–5.1)
SODIUM: 139 mmol/L (ref 135–145)

## 2016-03-02 MED ORDER — TRAMADOL HCL 50 MG PO TABS: 50.0000 mg | ORAL_TABLET | Freq: Three times a day (TID) | ORAL | Status: DC | PRN

## 2016-03-02 MED ORDER — FENTANYL CITRATE (PF) 100 MCG/2ML IJ SOLN
50.0000 ug | Freq: Once | INTRAMUSCULAR | Status: AC
  Administered 2016-03-02: 50 ug via INTRAVENOUS
  Filled 2016-03-02: qty 2

## 2016-03-02 NOTE — Patient Instructions (Signed)
Because you received an x-ray today, you will receive an invoice from [facility]. Please contact [facility] at [phone] with questions or concerns regarding your invoice. Our billing staff will not be able to assist you with those questions. °

## 2016-03-02 NOTE — Consult Note (Signed)
NAMColbert Coyer:  [place], Samar             ACCOUNT NO.:  [id number]  MEDICAL RECORD NO.:  [id number]  LOCATION:  A05C                         FACILITY:  [facility]  PHYSICIAN:  Kerin PernaPeter Van Trigt, M.D.  DATE OF BIRTH:  May 17, 1981  DATE OF CONSULTATION:  03/02/2016 DATE OF DISCHARGE:  03/02/2016                                CONSULTATION   REASON FOR CONSULTATION:  Spontaneous right pneumothorax, 10-15%.  HISTORY OF PRESENT ILLNESS:  I was asked to evaluate this 35 year old, African American female, nonsmoker, in the emergency department when she presented with right-sided chest pain of approximately 12 hours duration which has become worse.  It is sharp and pleuritic in nature.  She denies any preceding fall, trauma, strenuous lifting, or coughing.  She was evaluated in the emergency department and a chest x-ray showed a small right apical 10-15% pneumothorax with otherwise clear lung fields.  The patient has never had a previous spontaneous pneumothorax.  She has no history of asthma or chronic lung disease.  The patient's oxygen saturation on room air was 100%.  She was given a dose of IV fentanyl and this completely resolved her pleuritic type chest pain.  When I spoke and examined the patient, she was comfortable.  PAST MEDICAL HISTORY: 1. Anemia. 2. Anxiety disorder. 3. Scoliosis. 4. History of mitral valve disorder, probable prolapse.  PAST SURGICAL HISTORY:  C-section x3.  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  She is married with 7 children. She does not smoke or use alcohol.  FAMILY HISTORY:  No family history of spontaneous pneumothorax. Positive family history of hypertension, hyperlipidemia, heart disease, and stroke.  HOME MEDICATIONS:  Over-the-counter vitamins, ibuprofen, Sudafed, and iron.  ALLERGIES:  AZITHROMYCIN.  REVIEW OF SYSTEMS:  No history of recent fever, weight loss, or  upper respiratory infection.  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  The patient denies difficulty swallowing or abdominal pain.  Her weight has been stable. She denies diabetes or thyroid disease.  She denies history of DVT or claudication or TIA.  She has never had an echocardiogram.  She denies recurrent illnesses or bleeding disorder.  PHYSICAL EXAMINATION:  VITAL SIGNS:  Blood pressure 120/70, pulse 72 sinus rhythm, temperature 98 degrees, oxygen saturation 100% on room air.  Weight 116 pounds, height 5 feet 7. GENERAL APPEARANCE:  Middle age female in no acute distress. HEENT:  Normocephalic except for her deviated left eye and a birthmark over her left face. NECK:  Without JVD, mass, or bruit.  No crepitus. THORAX:  Breath sounds are clear and equal. CARDIAC:  Regular rhythm without murmur or gallop. ABDOMEN:  Soft, nontender, scaphoid. EXTREMITIES:  No clubbing, cyanosis, or edema. NEURO:  No focal motor deficit. SKIN:  Without rash or lesion.  LABORATORY DATA:  I personally reviewed the chest x-ray showing a small right apical pneumothorax.  There was no midline shift.  There was no subcutaneous emphysema.  IMPRESSION AND RECOMMENDATION:  The patient's first time spontaneous pneumothorax is very small.  She has no shortness of breath or current chest pain.  I have recommended that  she be discharged today and be given tramadol for pain to become very sedentary.  I have asked the emergency physician Dr. Pecola Leisure to arrange for the patient to have a followup outpatient chest x-ray in the [facility] system tomorrow.  I will see the patient on followup in my office on Monday with a chest x-ray.  The patient understands that if she has significant pain or shortness of breath that she should return to the emergency department for re- evaluation.  At this point, I would not recommend chest tube placement and feel that there  is an excellent chance that the small apical pneumothorax will resolve with time.     Kerin Perna, M.D.     PV/MEDQ  D:  03/02/2016  T:  03/02/2016  Job:  [id number]

## 2016-03-02 NOTE — Care Management Note (Signed)
Case Management Note  Patient Details  Name: Suzanne BaxterLatisha Banks Wade MRN: [id number] Date of Birth: 12/24/1981  Subjective/Objective:                  35 y.o. female who presents to the Emergency Department complaining of chest pain. She developed severe and sudden onset of right sided back pain and chest pain last night. Pain is worse with inspiration. She denies any injuries. She was seen in urgent care today and had a chest x-ray obtained that demonstrates a right-sided pneumothorax.// Home with spouse.  Action/Plan: Follow for disposition needs; set up follow-up chest x-ray for 03/03/16.   Expected Discharge Date:       03/02/2016           Expected Discharge Plan:  Home/Self Care  In-House Referral:  NA  Discharge planning Services  CM Consult, Follow-up appt scheduled  Post Acute Care Choice:  NA Choice offered to:  NA  DME Arranged:  N/A DME Agency:  NA  HH Arranged:  NA HH Agency:  NA  Status of Service:  Completed, signed off  Medicare Important Message Given:    Date Medicare IM Given:    Medicare IM give by:    Date Additional Medicare IM Given:    Additional Medicare Important Message give by:     If discussed at Long Length of Stay Meetings, dates discussed:    Additional Comments: ERCM consulted to setup follow-up CXR for 03/03/16.  ERCM called  Oletta CohnWood, Eleena Grater, RN 03/02/2016, 1:42 PM

## 2016-03-02 NOTE — ED Notes (Signed)
Pt arrives EMS from Urgent Care where she was found to have a small pneumothorax. Pt states sudden onset SHOB last night. Denies injury.

## 2016-03-02 NOTE — ED Provider Notes (Signed)
CSN: [id number]     Arrival date & time 03/02/16  1035 History   First MD Initiated Contact with Patient 03/02/16 1037     Chief Complaint  Patient presents with  . Chest Pain     Patient is a 35 y.o. female presenting with chest pain. The history is provided by the patient. No language interpreter was used.  Chest Pain  Suzanne Wade is a 35 y.o. female who presents to the Emergency Department complaining of chest pain.  She developed severe and sudden onset of right sided back pain and chest pain last night. Pain is worse with inspiration. She denies any injuries. She was seen in urgent care today and had a chest x-ray obtained that demonstrates a right-sided pneumothorax. She was transferred to the emergency department for further evaluation. She states she finds a position of comfort when she leans over little bit to the left side and takes shallow breaths. No prior history of similar symptoms. She has a history of anemia and a leaky heart valve. She took 2 aspirin today for the pain.  Past Medical History  Diagnosis Date  . Anxiety     panic- was out on FMLA  . Allergy   . Anemia   . Anxiety   . CHF (congestive heart failure) (HCC)   . Depression   . Heart murmur   . Neuromuscular disorder (HCC)   . Ulcer   . Scoliosis   . Panic attacks    Past Surgical History  Procedure Laterality Date  . Cesarean section     Family History  Problem Relation Age of Onset  . Hypertension Mother   . Heart disease Sister   . Hyperlipidemia Sister   . Hypertension Sister   . Cancer Sister     brain  . Cancer Maternal Grandmother     uterine  . Heart disease Maternal Grandmother   . Hyperlipidemia Maternal Grandmother   . Hypertension Maternal Grandmother   . Stroke Maternal Grandmother   . Cancer Maternal Grandfather     lung  . Heart disease Maternal Grandfather   . Hyperlipidemia Maternal Grandfather   . Hypertension Maternal Grandfather   . Stroke Maternal Grandfather     Social History  Substance Use Topics  . Smoking status: Former Games developermoker  . Smokeless tobacco: Never Used  . Alcohol Use: 0.0 oz/week    0 Standard drinks or equivalent per week     Comment: 2 drinks   OB History    Gravida Para Term Preterm AB TAB SAB Ectopic Multiple Living   6 3             Review of Systems  Cardiovascular: Positive for chest pain.  All other systems reviewed and are negative.     Allergies  Azithromycin; Nickel; and Z-pak  Home Medications   Prior to Admission medications   Medication Sig Start Date End Date Taking? Authorizing Provider  BIOTIN PO Take 1 tablet by mouth daily. Reported on 03/02/2016   Yes Historical Provider, MD  Cyanocobalamin (B-12 PO) Take 1 tablet by mouth daily.    Yes Historical Provider, MD  esomeprazole (NEXIUM) 20 MG capsule Take 20 mg by mouth daily at 12 noon.   Yes Historical Provider, MD  ferrous fumarate (HEMOCYTE - 106 MG FE) 325 (106 FE) MG TABS tablet Take 1 tablet by mouth.   Yes Historical Provider, MD  ibuprofen (ADVIL,MOTRIN) 200 MG tablet Take 200 mg by mouth every 6 (six) hours as  needed for moderate pain.   Yes Historical Provider, MD  Multiple Vitamin (MULTIVITAMIN) tablet Take 1 tablet by mouth daily.   Yes Historical Provider, MD  Prenatal Vit-Fe Fumarate-FA (PRENATAL MULTIVITAMIN) TABS tablet Take 1 tablet by mouth daily at 12 noon. Reported on 03/02/2016   Yes Historical Provider, MD  pseudoephedrine (SUDAFED) 30 MG tablet Take 30 mg by mouth every 4 (four) hours as needed for congestion.   Yes Historical Provider, MD  traMADol (ULTRAM) 50 MG tablet Take 1 tablet (50 mg total) by mouth every 8 (eight) hours as needed. 03/02/16   Tilden Fossa, MD   BP 123/77 mmHg  Pulse 73  Temp(Src) 97.8 F (36.6 C) (Oral)  Resp 23  Ht  (1.702 m)  Wt 116 lb (52.617 kg)  BMI 18.16 kg/m2  SpO2 100%  LMP 02/17/2016 Physical Exam  Constitutional: She is oriented to person, place, and time. She appears well-developed and  well-nourished.  Appears uncomfortable  HENT:  Head: Normocephalic and atraumatic.  Cardiovascular: Normal rate and regular rhythm.   No murmur heard. Pulmonary/Chest: Effort normal. No respiratory distress.  Decreased air movement bilaterally but patient cannot take a deep breath.  Abdominal: Soft. There is no tenderness. There is no rebound and no guarding.  Musculoskeletal: She exhibits no edema or tenderness.  Neurological: She is alert and oriented to person, place, and time.  Skin: Skin is warm and dry.  Psychiatric: She has a normal mood and affect. Her behavior is normal.  Nursing note and vitals reviewed.   ED Course  Procedures (including critical care time) Labs Review Labs Reviewed  BASIC METABOLIC PANEL - Abnormal; Notable for the following:    Calcium 10.4 (*)    All other components within normal limits  CBC WITH DIFFERENTIAL/PLATELET - Abnormal; Notable for the following:    RBC 5.19 (*)    All other components within normal limits    Imaging Review Dg Chest 2 View  03/02/2016  CLINICAL DATA:  Right-sided chest pain and shortness of breath ; history of CHF, former smoker. EXAM: CHEST  2 VIEW COMPARISON:  None in PACs FINDINGS: The lungs are hyperinflated. There is an approximately 15% right apical pneumothorax. The pleural line is partially obscured by the right fourth rib. There is a tiny right pleural effusion. The left lung is clear. The heart is normal in size. The mediastinum is not shifted. The pulmonary vascularity is not engorged. The bony thorax exhibits no acute fracture or other acute abnormality. IMPRESSION: Approximately 15% right-sided pneumothorax with trace basilar pleural effusion. There is no mediastinal shift. There is underlying reactive airway disease or COPD. Critical Value/emergent results were discussed by telephone by the at the time of interpretation on 03/02/2016 at 9:52 am with Dr. Lesle Chris , who verbally acknowledged these results.  Electronically Signed   By: David  [place] M.D.   On: 03/02/2016 09:55   I have personally reviewed and evaluated these images and lab results as part of my medical decision-making.   EKG Interpretation   Date/Time:  Friday March 02 2016 11:00:55 EST Ventricular Rate:  66 PR Interval:  179 QRS Duration: 80 QT Interval:  381 QTC Calculation: 399 R Axis:   137 Text Interpretation:  Sinus or ectopic atrial rhythm Probable lateral  infarct, age indeterminate Confirmed by Lincoln Brigham [id number]) on 03/02/2016  11:07:48 AM      MDM   Final diagnoses:  Spontaneous pneumothorax    Patient here from urgent care for evaluation of pneumothorax.  Her symptoms began last night with pleuritic chest and back pain. She has been hemodynamically stable in the emergency department. She was evaluated by Dr. Donata Clay with cardiothoracic surgery in the emergency department. He recommends discharge home with repeat chest x-ray tomorrow and follow-up in the office on Monday. On repeat assessment patient is feeling improved. With no respiratory distress Patient understands to return to [facility] tomorrow for repeat chest X-ray and outpatient office follow-up on Monday. She knows to return to the emergency department if she has any worrisome or worsening symptoms such as worsening chest pain or difficulty breathing. Providing tramadol for pain.  Tilden Fossa, MD 03/02/16 [phone]

## 2016-03-02 NOTE — Progress Notes (Signed)
By signing my name below, I, Raven Small, attest that this documentation has been prepared under the direction and in the presence of Lesle Chris, MD.  Electronically Signed: Andrew Au, ED Scribe. 03/02/2016. 9:32 AM.   Chief Complaint:  Chief Complaint  Patient presents with  . Shortness of Breath  . Dizziness  . other    right side numbness  . Back Pain    x t oday  . Chest Pain    x today    HPI: Suzanne Wade is a 35 y.o. female who reports to [facility] today complaining of severe, right sided, sharp, tight CP that began late last night. Pt reports gradual onset of pain last night, initially suspected to be anxiety. She took bear prior to bed but woke up this morning with worsening pain as well as SOB, right rib pain and right back pain . She reports pain with breathing, especially deep breaths and with touch. She has discomfort with sitting up straight and that when she tries to she has spotted vision as if she is about to pass out. She is more comfortable leaning of forward and finds it best to take tiny breaths due to pain. She reports hx of a leak in valve but does not typically have pain with this. She denies hx of sickle cell. She denies abdominal pain. She denies chance of pregnancy and states she cannot have children.   Pt states she fell last weekend while hiking. She injured left foot result in left left foot pain with certain movements, but denies injury to chest or ribs.  Past Medical History  Diagnosis Date  . Anxiety     panic- was out on FMLA  . Allergy   . Anemia   . Anxiety   . CHF (congestive heart failure) (HCC)   . Depression   . Heart murmur   . Neuromuscular disorder (HCC)   . Ulcer   . Scoliosis   . Panic attacks    Past Surgical History  Procedure Laterality Date  . Cesarean section     Social History   Social History  . Marital Status: Married    Spouse Name: N/A  . Number of Children: N/A  . Years of Education: N/A    Occupational History  . Life ins/ Mortgage    Social History Main Topics  . Smoking status: Former Games developer  . Smokeless tobacco: Never Used  . Alcohol Use: 0.0 oz/week    0 Standard drinks or equivalent per week     Comment: 2 drinks  . Drug Use: No  . Sexual Activity: Not Asked   Other Topics Concern  . None   Social History Narrative   ** Merged History Encounter **       ** Data from: 06/03/14 Enc Dept: [facility]       ** Data from: 06/04/14 Enc Dept: [facility]   Married. Education: [employer]. Exercise: yes.   Family History  Problem Relation Age of Onset  . Hypertension Mother   . Heart disease Sister   . Hyperlipidemia Sister   . Hypertension Sister   . Cancer Sister     brain  . Cancer Maternal Grandmother     uterine  . Heart disease Maternal Grandmother   . Hyperlipidemia Maternal Grandmother   . Hypertension Maternal Grandmother   . Stroke Maternal Grandmother   . Cancer Maternal Grandfather     lung  . Heart disease Maternal  Grandfather   . Hyperlipidemia Maternal Grandfather   . Hypertension Maternal Grandfather   . Stroke Maternal Grandfather    Allergies  Allergen Reactions  . Azithromycin Anaphylaxis  . Nickel     Breaks out, red rash  . Z-Pak [Azithromycin]     Closed throat up   Prior to Admission medications   Medication Sig Start Date End Date Taking? Authorizing Provider  Cyanocobalamin (B-12 PO) Take by mouth.   Yes Historical Provider, MD  ferrous fumarate (HEMOCYTE - 106 MG FE) 325 (106 FE) MG TABS tablet Take 1 tablet by mouth.   Yes Historical Provider, MD  Multiple Vitamin (MULTIVITAMIN) tablet Take 1 tablet by mouth daily.   Yes Historical Provider, MD  Prenatal Vit-Fe Fumarate-FA (PRENATAL MULTIVITAMIN) TABS tablet Take 1 tablet by mouth daily at 12 noon. Reported on 03/02/2016   Yes Historical Provider, MD  BIOTIN PO Take by mouth. Reported on 03/02/2016    Historical Provider, MD  ciprofloxacin (CIPRO) 500 MG  tablet Take 1 tablet (500 mg total) by mouth 2 (two) times daily. Patient not taking: Reported on 03/02/2016 10/28/15   Carmelina Dane, MD  cyclobenzaprine (FLEXERIL) 10 MG tablet Take 1 tablet (10 mg total) by mouth at bedtime. Muscle relaxer Patient not taking: Reported on 10/27/2015 04/16/15   Tonye Pearson, MD  esomeprazole (NEXIUM) 20 MG capsule Take 20 mg by mouth daily at 12 noon.    Historical Provider, MD  meloxicam (MOBIC) 15 MG tablet Take 1 tablet (15 mg total) by mouth daily. Patient not taking: Reported on 10/27/2015 04/16/15   Tonye Pearson, MD     ROS: The patient denies fevers, chills, night sweats, unintentional weight loss,  palpitations, wheezing, dyspnea on exertion, nausea, vomiting, abdominal pain, dysuria, hematuria, melena, numbness, weakness, or tingling.   All other systems have been reviewed and were otherwise negative with the exception of those mentioned in the HPI and as above.    PHYSICAL EXAM: Filed Vitals:   03/02/16 0929  BP: 104/60  Pulse: 76  Temp: 98 F (36.7 C)  Resp: 16   Body mass index is 18.63 kg/(m^2).   General: Alert, no acute distress HEENT:  Normocephalic, atraumatic, oropharynx patent. Eye: [facility] Cardiovascular:  Regular rate and rhythm, no rubs murmurs or gallops.  No Carotid bruits, radial pulse intact. No pedal edema.  Respiratory: Clear to auscultation bilaterally.  No wheezes, rales, or rhonchi.  No cyanosis, no use of accessory musculature. Decreased breath sounds on right. Abdominal: No organomegaly, abdomen is soft and non-tender, positive bowel sounds.  No masses. Musculoskeletal: Gait intact. No edema, tenderness Skin: No rashes. Neurologic: Facial musculature symmetric. Psychiatric: Patient acts appropriately throughout our interaction. Lymphatic: No cervical or submandibular lymphadenopathy    LABS:    EKG/XRAY:   Primary read interpreted by Dr. Cleta Alberts at [facility]. Dg Chest 2 View  03/02/2016   CLINICAL DATA:  Right-sided chest pain and shortness of breath ; history of CHF, former smoker. EXAM: CHEST  2 VIEW COMPARISON:  None in PACs FINDINGS: The lungs are hyperinflated. There is an approximately 15% right apical pneumothorax. The pleural line is partially obscured by the right fourth rib. There is a tiny right pleural effusion. The left lung is clear. The heart is normal in size. The mediastinum is not shifted. The pulmonary vascularity is not engorged. The bony thorax exhibits no acute fracture or other acute abnormality. IMPRESSION: Approximately 15% right-sided pneumothorax with trace basilar pleural effusion. There is no mediastinal shift.  There is underlying reactive airway disease or COPD. Critical Value/emergent results were discussed by telephone by the at the time of interpretation on 03/02/2016 at 9:52 am with Dr. Lesle ChrisSTEVEN Kiron Osmun , who verbally acknowledged these results. Electronically Signed   By: David  [place]azilandJordan M.D.   On: 03/02/2016 09:55   ASSESSMENT/PLAN: Patient's presentation and exam as well as chest x-ray worrisome for right-sided pneumo. EMS called she is transported to the emergency room for further evaluation. Reading per radiology is of a 15% right-sided pneumo. I personally performed the services described in this documentation, which was scribed in my presence. The recorded information has been reviewed and is accurate.  Gross sideeffects, risk and benefits, and alternatives of medications d/w patient. Patient is aware that all medications have potential sideeffects and we are unable to predict every sideeffect or drug-drug interaction that may occur.  Lesle ChrisSteven Bernie Ransford MD 03/02/2016 9:31 AM

## 2016-03-02 NOTE — Discharge Instructions (Signed)
Return [facility] ED 03/03/2016; inform staff at check in that you are to check in as out patient to receive follow up xray for pneumothorax. Pneumothorax A pneumothorax, commonly called a collapsed lung, is a condition in which air leaks from a lung and builds up in the space between the lung and the chest wall (pleural space). The air in a pneumothorax is trapped outside the lung and takes up space, preventing the lung from fully expanding. This is a condition that usually occurs suddenly. The buildup of air may be small or large. A small pneumothorax may go away on its own. When a pneumothorax is larger, it will often require medical treatment and hospitalization.  CAUSES  A pneumothorax can sometimes happen quickly with no apparent cause. People with underlying lung problems, particularly COPD or emphysema, are at higher risk of pneumothorax. However, pneumothorax can happen quickly even in people with no prior known lung problems. Trauma, surgery, medical procedures, or injury to the chest wall can also cause a pneumothorax. SIGNS AND SYMPTOMS  Sometimes a pneumothorax will have no symptoms. When symptoms are present, they can include:  Chest pain.  Shortness of breath.  Increased rate of breathing.  Bluish color to your lips or skin (cyanosis). DIAGNOSIS  Pneumothorax is usually diagnosed by a chest X-ray or chest CT scan. Your health care provider will also take a medical history and perform a physical exam to determine why you may have a pneumothorax. TREATMENT  A small pneumothorax may go away on its own without treatment. Extra oxygen can sometimes help a small pneumothorax go away more quickly. For a larger pneumothorax or a pneumothorax that is causing symptoms, a procedure is usually needed to drain the air.In some cases, the health care provider may drain the air using a needle. In other cases, a chest tube may be inserted into the pleural space. A chest tube is a small tube placed  between the ribs and into the pleural space. This removes the extra air and allows the lung to expand back to its normal size. A large pneumothorax will usually require a hospital stay. If there is ongoing air leakage into the pleural space, then the chest tube may need to remain in place for several days until the air leak has healed. In some cases, surgery may be needed.  HOME CARE INSTRUCTIONS   Only take over-the-counter or prescription medicines as directed by your health care provider.  If a cough or pain makes it difficult for you to sleep at night, try sleeping in a semi-upright position in a recliner or by using 2 or 3 pillows.  Rest and limit activity as directed by your health care provider.  If you had a chest tube and it was removed, ask your health care provider when it is okay to remove the dressing. Until your health care provider says you can remove the dressing, do not allow it to get wet.  Do not smoke. Smoking is a risk factor for pneumothorax.  Do not fly in an airplane or scuba dive until your health care provider says it is okay.  Follow up with your health care provider as directed. SEEK IMMEDIATE MEDICAL CARE IF:   You have increasing chest pain or shortness of breath.  You have a cough that is not controlled with suppressants.  You begin coughing up blood.  You have pain that is getting worse or is not controlled with medicines.  You cough up thick, discolored mucus (sputum)  that is yellow to green in color.  You have redness, increasing pain, or discharge at the site where a chest tube had been in place (if your pneumothorax was treated with a chest tube).  The site where your chest tube was located opens up.  You feel air coming out of the site where the chest tube was placed.  You have a fever or persistent symptoms for more than 2-3 days.  You have a fever and your symptoms suddenly get worse. MAKE SURE YOU:   Understand these instructions.  Will  watch your condition.  Will get help right away if you are not doing well or get worse.   This information is not intended to replace advice given to you by your health care provider. Make sure you discuss any questions you have with your health care provider.   Document Released: 12/17/2005 Document Revised: 10/07/2013 Document Reviewed: 07/16/2013 Elsevier Interactive Patient Education [employer]

## 2016-03-03 ENCOUNTER — Telehealth (HOSPITAL_COMMUNITY): Payer: Self-pay

## 2016-03-03 ENCOUNTER — Ambulatory Visit (HOSPITAL_COMMUNITY)
Admission: RE | Admit: 2016-03-03 | Discharge: 2016-03-03 | Disposition: A | Discharge status: Home / Self Care | Payer: BLUE CROSS/BLUE SHIELD | Source: Ambulatory Visit | Attending: Emergency Medicine | Admitting: Emergency Medicine

## 2016-03-03 DIAGNOSIS — J939 Pneumothorax, unspecified: Secondary | ICD-10-CM | POA: Diagnosis not present

## 2016-03-03 DIAGNOSIS — J90 Pleural effusion, not elsewhere classified: Secondary | ICD-10-CM | POA: Insufficient documentation

## 2016-03-03 NOTE — Telephone Encounter (Signed)
Pt calling rcvd call about repeat chest xray she had and was returning call.  Call transferred to Dr Judd Lienelo xray results and follow up

## 2016-03-03 NOTE — ED Provider Notes (Signed)
Called by Radiologist regarding CXR that demonstrates increasing size of pneumothorax.  Patient left imaging without being able to discuss findings.  Discussed with Dr. Laneta [facility]mersBartle with [facility] who reviewed the xray - he recommends outpatient follow up as scheduled.   Tilden FossaElizabeth Tavaras Goody, MD 03/03/16 [phone]

## 2016-03-03 NOTE — ED Provider Notes (Signed)
I was asked by patient placement to speak with this patient regarding her follow-up chest x-ray from her pneumothorax. She apparently experienced a spontaneous pneumothorax yesterday and this was diagnosed by chest x-ray. She was advised to come back today for a repeat chest x-ray. She had this x-ray performed, however did not stay for the results. Dr. Madilyn Hookees had attempted to call her and left a message. The patient is calling back to find out the results.  From my review of the chart, it appears as though there is a minimal increase in the size of the pneumothorax. Dr. Madilyn Hookees had spoken with Dr. Laneta SimmersBartle from cardiothoracic surgery who feels as though the patient can still follow-up safely on Monday in the office. I explained this to the patient. I also informed her that if she develops difficulty breathing or worsening pain, she should return to the ER to be reevaluated.  Geoffery Lyonsouglas Jorgia Manthei, MD 03/03/16 [phone]

## 2016-03-05 ENCOUNTER — Ambulatory Visit (INDEPENDENT_AMBULATORY_CARE_PROVIDER_SITE_OTHER): Payer: 59 | Admitting: Cardiothoracic Surgery

## 2016-03-05 ENCOUNTER — Encounter: Payer: Self-pay | Admitting: Cardiothoracic Surgery

## 2016-03-05 ENCOUNTER — Ambulatory Visit
Admission: RE | Admit: 2016-03-05 | Discharge: 2016-03-05 | Disposition: A | Discharge status: Home / Self Care | Payer: 59 | Source: Ambulatory Visit | Attending: Cardiothoracic Surgery | Admitting: Cardiothoracic Surgery

## 2016-03-05 VITALS — BP 108/62 | HR 72 | Resp 20 | Ht 67.0 in | Wt 116.0 lb

## 2016-03-05 DIAGNOSIS — J939 Pneumothorax, unspecified: Secondary | ICD-10-CM

## 2016-03-05 NOTE — Progress Notes (Signed)
PCP is No PCP Per Patient Referring Provider is Tilden Fossaees, Elizabeth, MD  Chief Complaint  Patient presents with  . Spontaneous Pneumothorax    f/u from hospital consult 03/02/16 with CXR, Spontaneous right pneumothorax, 10-15%.    HPI:  very nice 35 year old AA female who returns the office in followup for a small right spontaneous pneumothorax which he developed on March 3 he was seen in the emergency department. This was a 10-15% pneumothorax and conservative management was recommended. She had mild pain but no shortness of breath with a saturation room air 100%. She was seen the next day by the urgent care staff with chest x-ray which showed slight increase in the pneumothorax but no significant change in symptoms. She returns today saying that she can breathe better and her pain is better. She is taking tramadol as needed for pain up to 3 times a day. No productive cough.  This is the patient's first episode of spontaneous pneumothorax. The patient states she stopped smoking about a month ago.  Past Medical History  Diagnosis Date  . Anxiety     panic- was out on FMLA  . Allergy   . Anemia   . Anxiety   . CHF (congestive heart failure) (HCC)   . Depression   . Heart murmur   . Neuromuscular disorder (HCC)   . Ulcer   . Scoliosis   . Panic attacks     Past Surgical History  Procedure Laterality Date  . Cesarean section      Family History  Problem Relation Age of Onset  . Hypertension Mother   . Heart disease Sister   . Hyperlipidemia Sister   . Hypertension Sister   . Cancer Sister     brain  . Cancer Maternal Grandmother     uterine  . Heart disease Maternal Grandmother   . Hyperlipidemia Maternal Grandmother   . Hypertension Maternal Grandmother   . Stroke Maternal Grandmother   . Cancer Maternal Grandfather     lung  . Heart disease Maternal Grandfather   . Hyperlipidemia Maternal Grandfather   . Hypertension Maternal Grandfather   . Stroke Maternal Grandfather      Social History Social History  Substance Use Topics  . Smoking status: Former Games developermoker  . Smokeless tobacco: Never Used  . Alcohol Use: 0.0 oz/week    0 Standard drinks or equivalent per week     Comment: 2 drinks    Current Outpatient Prescriptions  Medication Sig Dispense Refill  . BIOTIN PO Take 1 tablet by mouth daily. Reported on 03/02/2016    . Cyanocobalamin (B-12 PO) Take 1 tablet by mouth daily.     Marland Kitchen. esomeprazole (NEXIUM) 20 MG capsule Take 20 mg by mouth daily at 12 noon.    . ferrous fumarate (HEMOCYTE - 106 MG FE) 325 (106 FE) MG TABS tablet Take 1 tablet by mouth.    Marland Kitchen. ibuprofen (ADVIL,MOTRIN) 200 MG tablet Take 200 mg by mouth every 6 (six) hours as needed for moderate pain.    . Multiple Vitamin (MULTIVITAMIN) tablet Take 1 tablet by mouth daily.    . Prenatal Vit-Fe Fumarate-FA (PRENATAL MULTIVITAMIN) TABS tablet Take 1 tablet by mouth daily at 12 noon. Reported on 03/02/2016    . pseudoephedrine (SUDAFED) 30 MG tablet Take 30 mg by mouth every 4 (four) hours as needed for congestion.    . traMADol (ULTRAM) 50 MG tablet Take 1 tablet (50 mg total) by mouth every 8 (eight) hours as needed. 8  tablet 0   No current facility-administered medications for this visit.    Allergies  Allergen Reactions  . Azithromycin Anaphylaxis  . Nickel     Breaks out, red rash  . Z-Pak [Azithromycin]     Closed throat up    Review of Systems    no shortness of breath No hemoptysis Complaining of increased hiccups and increased gas symptoms  BP 108/62 mmHg  Pulse 72  Resp 20  Ht  (1.702 m)  Wt 116 lb (52.617 kg)  BMI 18.16 kg/m2  SpO2 98%  LMP 02/17/2016 Physical Exam Alert and comfortable No crepitus in the neck Lungs clear with equal breath sounds bilaterally Heart rhythm regular without gallop  Diagnostic Tests: Chest x-ray performed today shows significant improvement in the small right spontaneous pneumothorax now 5-10%  Impression: Clinical improvemen  tof pneumothorax-right side Significant improvement on chest x-ray from the previous x-ray 48 hours ago  Plan:continue with no working, light activities, and when necessary tramadol Return for chest x-ray in followup in 48 hours - to the office.   Mikey Bussing, MD [facility] [phone]

## 2016-03-06 ENCOUNTER — Other Ambulatory Visit: Payer: Self-pay | Admitting: Cardiothoracic Surgery

## 2016-03-06 DIAGNOSIS — J939 Pneumothorax, unspecified: Secondary | ICD-10-CM

## 2016-03-07 ENCOUNTER — Encounter: Payer: Self-pay | Admitting: Cardiothoracic Surgery

## 2016-03-07 ENCOUNTER — Ambulatory Visit (INDEPENDENT_AMBULATORY_CARE_PROVIDER_SITE_OTHER): Payer: 59 | Admitting: Cardiothoracic Surgery

## 2016-03-07 ENCOUNTER — Ambulatory Visit
Admission: RE | Admit: 2016-03-07 | Discharge: 2016-03-07 | Disposition: A | Discharge status: Home / Self Care | Payer: 59 | Source: Ambulatory Visit | Attending: Cardiothoracic Surgery | Admitting: Cardiothoracic Surgery

## 2016-03-07 VITALS — BP 102/66 | HR 75 | Resp 20 | Ht 67.0 in | Wt 116.0 lb

## 2016-03-07 DIAGNOSIS — J939 Pneumothorax, unspecified: Secondary | ICD-10-CM | POA: Diagnosis not present

## 2016-03-07 NOTE — Progress Notes (Signed)
PCP is No PCP Per Patient Referring Provider is Tilden Fossaees, Elizabeth, MD  Chief Complaint  Patient presents with  . Spontaneous Pneumothorax    2 day f/u with CXR    [id number]:right spontaneous pneumothorax, small being treated conservatively without chest tube. She returns for repeat exam and a chest x-ray. Her symptoms of chest pain had almost completely resolved. Chest x-ray today shows significant improvement and almost complete resolution of the spontaneous right pneumothorax. She will refrain fromwork until Monday. To avoid vigorous or strenuous exercise until mid April. The patient understands that the risk of recurrent spontaneous pneumothorax is 15% in the next 6 weeks. She  wilreport recurrent symptoms for office or after hours report to emergency department urgent care for x-ray. She is committed to refrain from smoking.  Past Medical History  Diagnosis Date  . Anxiety     panic- was out on FMLA  . Allergy   . Anemia   . Anxiety   . CHF (congestive heart failure) (HCC)   . Depression   . Heart murmur   . Neuromuscular disorder (HCC)   . Ulcer   . Scoliosis   . Panic attacks     Past Surgical History  Procedure Laterality Date  . Cesarean section      Family History  Problem Relation Age of Onset  . Hypertension Mother   . Heart disease Sister   . Hyperlipidemia Sister   . Hypertension Sister   . Cancer Sister     brain  . Cancer Maternal Grandmother     uterine  . Heart disease Maternal Grandmother   . Hyperlipidemia Maternal Grandmother   . Hypertension Maternal Grandmother   . Stroke Maternal Grandmother   . Cancer Maternal Grandfather     lung  . Heart disease Maternal Grandfather   . Hyperlipidemia Maternal Grandfather   . Hypertension Maternal Grandfather   . Stroke Maternal Grandfather     Social History Social History  Substance Use Topics  . Smoking status: Former Games developermoker  . Smokeless tobacco: Never Used  . Alcohol Use: 0.0 oz/week    0 Standard  drinks or equivalent per week     Comment: 2 drinks    Current Outpatient Prescriptions  Medication Sig Dispense Refill  . BIOTIN PO Take 1 tablet by mouth daily. Reported on 03/02/2016    . Cyanocobalamin (B-12 PO) Take 1 tablet by mouth daily.     Marland Kitchen. esomeprazole (NEXIUM) 20 MG capsule Take 20 mg by mouth daily at 12 noon.    . ferrous fumarate (HEMOCYTE - 106 MG FE) 325 (106 FE) MG TABS tablet Take 1 tablet by mouth.    Marland Kitchen. ibuprofen (ADVIL,MOTRIN) 200 MG tablet Take 200 mg by mouth every 6 (six) hours as needed for moderate pain.    . Multiple Vitamin (MULTIVITAMIN) tablet Take 1 tablet by mouth daily.    . Prenatal Vit-Fe Fumarate-FA (PRENATAL MULTIVITAMIN) TABS tablet Take 1 tablet by mouth daily at 12 noon. Reported on 03/02/2016    . pseudoephedrine (SUDAFED) 30 MG tablet Take 30 mg by mouth every 4 (four) hours as needed for congestion.    . traMADol (ULTRAM) 50 MG tablet Take 1 tablet (50 mg total) by mouth every 8 (eight) hours as needed. 8 tablet 0   No current facility-administered medications for this visit.    Allergies  Allergen Reactions  . Azithromycin Anaphylaxis  . Nickel     Breaks out, red rash  . Z-Pak [Azithromycin]  Closed throat up    Review of Systems  No chest pain or shortness of breath No productive cough No fever Sleeping and appetite improved Not requiring any pain medication-tramadol  BP 102/66 mmHg  Pulse 75  Resp 20  Ht  (1.702 m)  Wt 116 lb (52.617 kg)  BMI 18.16 kg/m2  SpO2 98%  LMP 02/17/2016 Physical Exam Alert and comfortable No crepitus in the neck Breath sounds are clear and equal bilaterally Heart rhythm is regular Neuro intact  Diagnostic Tests: Chest x-ray with almost complete resolution of the small right apical pneumothorax  Impression: Successful resolution of spontaneous pneumothorax with conservative management  Plan: Return as needed for recurrent symptoms  -  she is aware of the chance of recurrent  pneumothorax.  Mikey Bussing, MD [facility] [phone]

## 2016-10-27 ENCOUNTER — Ambulatory Visit (INDEPENDENT_AMBULATORY_CARE_PROVIDER_SITE_OTHER): Payer: BLUE CROSS/BLUE SHIELD | Admitting: Family Medicine

## 2016-10-27 VITALS — BP 110/70 | HR 76 | Temp 98.0°F | Resp 16 | Ht 67.0 in | Wt 121.2 lb

## 2016-10-27 DIAGNOSIS — Z0000 Encounter for general adult medical examination without abnormal findings: Secondary | ICD-10-CM

## 2016-10-27 DIAGNOSIS — Z23 Encounter for immunization: Secondary | ICD-10-CM

## 2016-10-27 NOTE — Progress Notes (Signed)
Chief Complaint  Patient presents with  . Annual Exam    pap  . Medication Refill    tramadol    Subjective:  Suzanne Wade is a 35 y.o. female here for a health maintenance visit.  Patient is established pt  She reports that she has been having pain from her pneumothorax she would like tramadol today  Patient Active Problem List   Diagnosis Date Noted  . Syncope and collapse 02/22/2014  . Anxiety state, unspecified 02/22/2014    Past Medical History:  Diagnosis Date  . Allergy   . Anemia   . Anxiety    panic- was out on FMLA  . Anxiety   . CHF (congestive heart failure) ([place])   . Depression   . Heart murmur   . Neuromuscular disorder ([place])   . Panic attacks   . Scoliosis   . Ulcer [facility])     Past Surgical History:  Procedure Laterality Date  . CESAREAN SECTION       Outpatient Medications Prior to Visit  Medication Sig Dispense Refill  . BIOTIN PO Take 1 tablet by mouth daily. Reported on 03/02/2016    . Cyanocobalamin (B-12 PO) Take 1 tablet by mouth daily.     . ferrous fumarate (HEMOCYTE - 106 MG FE) 325 (106 FE) MG TABS tablet Take 1 tablet by mouth.    . Multiple Vitamin (MULTIVITAMIN) tablet Take 1 tablet by mouth daily.    . Prenatal Vit-Fe Fumarate-FA (PRENATAL MULTIVITAMIN) TABS tablet Take 1 tablet by mouth daily at 12 noon. Reported on 03/02/2016    . traMADol (ULTRAM) 50 MG tablet Take 1 tablet (50 mg total) by mouth every 8 (eight) hours as needed. 8 tablet 0  . esomeprazole (NEXIUM) 20 MG capsule Take 20 mg by mouth daily at 12 noon.    Marland Kitchen ibuprofen (ADVIL,MOTRIN) 200 MG tablet Take 200 mg by mouth every 6 (six) hours as needed for moderate pain.    . pseudoephedrine (SUDAFED) 30 MG tablet Take 30 mg by mouth every 4 (four) hours as needed for congestion.     No facility-administered medications prior to visit.     Allergies  Allergen Reactions  . Azithromycin Anaphylaxis  . Nickel     Breaks out, red rash  . Z-Pak [Azithromycin]      Closed throat up     Family History  Problem Relation Age of Onset  . Hypertension Mother   . Heart disease Sister   . Hyperlipidemia Sister   . Hypertension Sister   . Cancer Sister     brain  . Cancer Maternal Grandmother     uterine  . Heart disease Maternal Grandmother   . Hyperlipidemia Maternal Grandmother   . Hypertension Maternal Grandmother   . Stroke Maternal Grandmother   . Cancer Maternal Grandfather     lung  . Heart disease Maternal Grandfather   . Hyperlipidemia Maternal Grandfather   . Hypertension Maternal Grandfather   . Stroke Maternal Grandfather      Health Habits: Dental Exam: up to date Eye Exam: up to date Exercise: 3-4 times/week on average Current exercise activities: walking/running Diet: balanced diet  Social History   Social History  . Marital status: Married    Spouse name: N/A  . Number of children: N/A  . Years of education: N/A   Occupational History  . Life ins/ Mortgage    Social History Main Topics  . Smoking status: Former Research scientist (life sciences)  . Smokeless tobacco: Never Used  .  Alcohol use 0.0 oz/week     Comment: 2 drinks  . Drug use: No  . Sexual activity: Not on file   Other Topics Concern  . Not on file   Social History Narrative   ** Merged History Encounter **       ** Data from: 06/03/14 Enc Dept: [facility]       ** Data from: 06/04/14 Enc Dept: [facility]   Married. Education: [employer]. Exercise: yes.   History  Alcohol Use  . 0.0 oz/week    Comment: 2 drinks   History  Smoking Status  . Former Smoker  Smokeless Tobacco  . Never Used   History  Drug Use No    GYN: Sexual Health Menstrual status: regular menses LMP: Patient's last menstrual period was 10/20/2016. Last pap smear: see HM section History of abnormal pap smears:  Sexually active: with female partner Current contraception:   Health Maintenance: See under health Maintenance activity for review of completion dates as  well. Immunization History  Administered Date(s) Administered  . Influenza,inj,Quad PF,36+ Mos 10/27/2016  . Influenza-Unspecified 09/29/2013      Depression Screen-PHQ2/9 Depression screen [facility] 2/9 10/27/2016 03/02/2016 10/27/2015  Decreased Interest 0 0 0  Down, Depressed, Hopeless 0 0 0  PHQ - 2 Score 0 0 0      Depression Severity and Treatment Recommendations:  0-4= None  5-9= Mild / Treatment: Support, educate to call if worse; return in one month  10-14= Moderate / Treatment: Support, watchful waiting; Antidepressant or Psycotherapy  15-19= Moderately severe / Treatment: Antidepressant OR Psychotherapy  >= 20 = Major depression, severe / Antidepressant AND Psychotherapy    Review of Systems   Review of Systems  Constitutional: Negative for chills, fever and weight loss.  HENT: Negative for sore throat.   Eyes: Negative for blurred vision and double vision.  Cardiovascular: Negative for leg swelling.       Pleuritic chest pain on the right since having pneumothorax  Skin: Negative for itching and rash.  Neurological: Negative for headaches.       Reports tremors down the right arm with shaking of the hand    See HPI for ROS as well.    Objective:   Vitals:   10/27/16 1100  BP: 110/70  Pulse: 76  Resp: 16  Temp: 98 F (36.7 C)  TempSrc: Oral  SpO2: 97%  Weight: 121 lb 3.2 oz (55 kg)  Height: 5' 7"  (1.702 m)    Body mass index is 18.98 kg/m.  Physical Exam  Constitutional: She is oriented to person, place, and time. She appears well-developed and well-nourished.  HENT:  Head: Normocephalic and atraumatic.  Eyes: Conjunctivae and EOM are normal.  Neck: Normal range of motion. Neck supple.  Cardiovascular: Normal rate, regular rhythm and normal heart sounds.   Musculoskeletal: Normal range of motion. She exhibits no edema.  Neurological: She is alert and oriented to person, place, and time. No cranial nerve deficit.  Skin: No rash noted. No  erythema.  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 Her behavior is normal. Judgment and thought content normal.       Assessment/Plan:   Patient was seen for a health maintenance exam.  Counseled the patient on health maintenance issues. Reviewed her health mainteance schedule and ordered appropriate tests (see orders.) Counseled on regular exercise and weight management. Recommend regular eye exams and dental  cleaning.   The following issues were addressed today for health maintenance:   Keiona was seen today for annual exam and medication refill.  Diagnoses and all orders for this visit:  Annual physical exam- age related screenings done Advised pt to return for fasting labs -     CBC; Future -     Lipid panel; Future -     Comprehensive metabolic panel; Future -     TSH; Future  Need for prophylactic vaccination and inoculation against influenza -     Flu Vaccine QUAD 36+ mos IM  Follow up for additional visit for concerns   No Follow-up on file.    Body mass index is 18.98 kg/m.:  Discussed the patient's BMI with patient. The BMI body mass index is 18.98 kg/m.    Future Appointments Date Time Provider [place]  10/29/2016 1:30 PM [facility] Essie Christine ([id number]) [facility]    Patient Instructions       IF you received an x-ray today, you will receive an invoice from [facility]. Please contact [facility] at [phone] with questions or concerns regarding your invoice.   IF you received labwork today, you will receive an invoice from [employer]. Please contact [facility] at [phone] with questions or concerns regarding your invoice.   Our billing staff will not be able to assist you with questions regarding bills from these companies.  You will be contacted with the lab results as soon as they are available. The fastest way to get your results  is to activate your My Chart account. Instructions are located on the last page of this paperwork. If you have not heard from [place] regarding the results in 2 weeks, please contact this office.     Health Maintenance, Female Adopting a healthy lifestyle and getting preventive care can go a long way to promote health and wellness. Talk with your health care provider about what schedule of regular examinations is right for you. This is a good chance for you to check in with your provider about disease prevention and staying healthy. In between checkups, there are plenty of things you can do on your own. Experts have done a lot of research about which lifestyle changes and preventive measures are most likely to keep you healthy. Ask your health care provider for more information. WEIGHT AND DIET  Eat a healthy diet  Be sure to include plenty of vegetables, fruits, low-fat dairy products, and lean protein.  Do not eat a lot of foods high in solid fats, added sugars, or salt.  Get regular exercise. This is one of the most important things you can do for your health.  Most adults should exercise for at least 150 minutes each week. The exercise should increase your heart rate and make you sweat (moderate-intensity exercise).  Most adults should also do strengthening exercises at least twice a week. This is in addition to the moderate-intensity exercise.  Maintain a healthy weight  Body mass index (BMI) is a measurement that can be used to identify possible weight problems. It estimates body fat based on height and weight. Your health care provider can help determine your BMI and help you achieve or maintain a healthy weight.  For females 26 years of age and older:   A BMI below 18.5 is considered underweight.  A BMI of 18.5 to 24.9 is normal.  A BMI of 25 to 29.9 is considered overweight.  A BMI of 30 and above is considered obese.  Watch levels of cholesterol and blood lipids  You should  start having your blood tested for lipids and cholesterol at 37 years of age, then have this test every 5 years.  You may need to have your cholesterol levels checked more often if:  Your lipid or cholesterol levels are high.  You are older than 37 years of age.  You are at high risk for heart disease.  CANCER SCREENING   Lung Cancer  Lung cancer screening is recommended for adults 89-74 years old who are at high risk for lung cancer because of a history of smoking.  A yearly low-dose CT scan of the lungs is recommended for people who:  Currently smoke.  Have quit within the past 15 years.  Have at least a 30-pack-year history of smoking. A pack year is smoking an average of one pack of cigarettes a day for 1 year.  Yearly screening should continue until it has been 15 years since you quit.  Yearly screening should stop if you develop a health problem that would prevent you from having lung cancer treatment.  Breast Cancer  Practice breast self-awareness. This means understanding how your breasts normally appear and feel.  It also means doing regular breast self-exams. Let your health care provider know about any changes, no matter how small.  If you are in your 20s or 30s, you should have a clinical breast exam (CBE) by a health care provider every 1-3 years as part of a regular health exam.  If you are 5 or older, have a CBE every year. Also consider having a breast X-ray (mammogram) every year.  If you have a family history of breast cancer, talk to your health care provider about genetic screening.  If you are at high risk for breast cancer, talk to your health care provider about having an MRI and a mammogram every year.  Breast cancer gene (BRCA) assessment is recommended for women who have family members with BRCA-related cancers. BRCA-related cancers include:  Breast.  Ovarian.  Tubal.  Peritoneal cancers.  Results of the assessment will determine the  need for genetic counseling and BRCA1 and BRCA2 testing. Cervical Cancer Your health care provider may recommend that you be screened regularly for cancer of the pelvic organs (ovaries, uterus, and vagina). This screening involves a pelvic examination, including checking for microscopic changes to the surface of your cervix (Pap test). You may be encouraged to have this screening done every 3 years, beginning at age 102.  For women ages 24-65, health care providers may recommend pelvic exams and Pap testing every 3 years, or they may recommend the Pap and pelvic exam, combined with testing for human papilloma virus (HPV), every 5 years. Some types of HPV increase your risk of cervical cancer. Testing for HPV may also be done on women of any age with unclear Pap test results.  Other health care providers may not recommend any screening for nonpregnant women who are considered low risk for pelvic cancer and who do not have symptoms. Ask your health care provider if a screening pelvic exam is right for you.  If you have had past treatment for cervical cancer or a condition that could lead to cancer, you need Pap tests and screening for cancer for at least 20 years after your treatment. If Pap tests have been discontinued, your risk factors (such as having a new sexual partner) need to be reassessed to determine if screening should resume. Some women have medical problems that  increase the chance of getting cervical cancer. In these cases, your health care provider may recommend more frequent screening and Pap tests. Colorectal Cancer  This type of cancer can be detected and often prevented.  Routine colorectal cancer screening usually begins at 37 years of age and continues through 37 years of age.  Your health care provider may recommend screening at an earlier age if you have risk factors for colon cancer.  Your health care provider may also recommend using home test kits to check for hidden blood in  the stool.  A small camera at the end of a tube can be used to examine your colon directly (sigmoidoscopy or colonoscopy). This is done to check for the earliest forms of colorectal cancer.  Routine screening usually begins at age 15.  Direct examination of the colon should be repeated every 5-10 years through 36 years of age. However, you may need to be screened more often if early forms of precancerous polyps or small growths are found. Skin Cancer  Check your skin from head to toe regularly.  Tell your health care provider about any new moles or changes in moles, especially if there is a change in a mole's shape or color.  Also tell your health care provider if you have a mole that is larger than the size of a pencil eraser.  Always use sunscreen. Apply sunscreen liberally and repeatedly throughout the day.  Protect yourself by wearing long sleeves, pants, a wide-brimmed hat, and sunglasses whenever you are outside. HEART DISEASE, DIABETES, AND HIGH BLOOD PRESSURE   High blood pressure causes heart disease and increases the risk of stroke. High blood pressure is more likely to develop in:  People who have blood pressure in the high end of the normal range (130-139/85-89 mm Hg).  People who are overweight or obese.  People who are African American.  If you are 56-58 years of age, have your blood pressure checked every 3-5 years. If you are 25 years of age or older, have your blood pressure checked every year. You should have your blood pressure measured twice--once when you are at a hospital or clinic, and once when you are not at a hospital or clinic. Record the average of the two measurements. To check your blood pressure when you are not at a hospital or clinic, you can use:  An automated blood pressure machine at a pharmacy.  A home blood pressure monitor.  If you are between 83 years and 62 years old, ask your health care provider if you should take aspirin to prevent  strokes.  Have regular diabetes screenings. This involves taking a blood sample to check your fasting blood sugar level.  If you are at a normal weight and have a low risk for diabetes, have this test once every three years after 37 years of age.  If you are overweight and have a high risk for diabetes, consider being tested at a younger age or more often. PREVENTING INFECTION  Hepatitis B  If you have a higher risk for hepatitis B, you should be screened for this virus. You are considered at high risk for hepatitis B if:  You were born in a country where hepatitis B is common. Ask your health care provider which countries are considered high risk.  Your parents were born in a high-risk country, and you have not been immunized against hepatitis B (hepatitis B vaccine).  You have HIV or AIDS.  You use needles to inject street  drugs.  You live with someone who has hepatitis B.  You have had sex with someone who has hepatitis B.  You get hemodialysis treatment.  You take certain medicines for conditions, including cancer, organ transplantation, and autoimmune conditions. Hepatitis C  Blood testing is recommended for:  Everyone born from [birth year] through 1967.  Anyone with known risk factors for hepatitis C. Sexually transmitted infections (STIs)  You should be screened for sexually transmitted infections (STIs) including gonorrhea and chlamydia if:  You are sexually active and are younger than 37 years of age.  You are older than 37 years of age and your health care provider tells you that you are at risk for this type of infection.  Your sexual activity has changed since you were last screened and you are at an increased risk for chlamydia or gonorrhea. Ask your health care provider if you are at risk.  If you do not have HIV, but are at risk, it may be recommended that you take a prescription medicine daily to prevent HIV infection. This is called pre-exposure prophylaxis  (PrEP). You are considered at risk if:  You are sexually active and do not regularly use condoms or know the HIV status of your partner(s).  You take drugs by injection.  You are sexually active with a partner who has HIV. Talk with your health care provider about whether you are at high risk of being infected with HIV. If you choose to begin PrEP, you should first be tested for HIV. You should then be tested every 3 months for as long as you are taking PrEP.  PREGNANCY   If you are premenopausal and you may become pregnant, ask your health care provider about preconception counseling.  If you may become pregnant, take 400 to 800 micrograms (mcg) of folic acid every day.  If you want to prevent pregnancy, talk to your health care provider about birth control (contraception). OSTEOPOROSIS AND MENOPAUSE   Osteoporosis is a disease in which the bones lose minerals and strength with aging. This can result in serious bone fractures. Your risk for osteoporosis can be identified using a bone density scan.  If you are 85 years of age or older, or if you are at risk for osteoporosis and fractures, ask your health care provider if you should be screened.  Ask your health care provider whether you should take a calcium or vitamin D supplement to lower your risk for osteoporosis.  Menopause may have certain physical symptoms and risks.  Hormone replacement therapy may reduce some of these symptoms and risks. Talk to your health care provider about whether hormone replacement therapy is right for you.  HOME CARE INSTRUCTIONS   Schedule regular health, dental, and eye exams.  Stay current with your immunizations.   Do not use any tobacco products including cigarettes, chewing tobacco, or electronic cigarettes.  If you are pregnant, do not drink alcohol.  If you are breastfeeding, limit how much and how often you drink alcohol.  Limit alcohol intake to no more than 1 drink per day for  nonpregnant women. One drink equals 12 ounces of beer, 5 ounces of wine, or 1 ounces of hard liquor.  Do not use street drugs.  Do not share needles.  Ask your health care provider for help if you need support or information about quitting drugs.  Tell your health care provider if you often feel depressed.  Tell your health care provider if you have ever been abused or do  not feel safe at home.   This information is not intended to replace advice given to you by your health care provider. Make sure you discuss any questions you have with your health care provider.   Document Released: 07/02/2011 Document Revised: 01/07/2015 Document Reviewed: 11/18/2013 Elsevier Interactive Patient Education [employer].

## 2016-10-27 NOTE — Patient Instructions (Addendum)
   IF you received an x-ray today, you will receive an invoice from [facility]. Please contact [facility] at [phone] with questions or concerns regarding your invoice.   IF you received labwork today, you will receive an invoice from [facility]. Please contact [facility] at [phone] with questions or concerns regarding your invoice.   Our billing staff will not be able to assist you with questions regarding bills from these companies.  You will be contacted with the lab results as soon as they are available. The fastest way to get your results is to activate your My Chart account. Instructions are located on the last page of this paperwork. If you have not heard from us regarding the results in 2 weeks, please contact this office.     Health Maintenance, Female Adopting a healthy lifestyle and getting preventive care can go a long way to promote health and wellness. Talk with your health care provider about what schedule of regular examinations is right for you. This is a good chance for you to check in with your provider about disease prevention and staying healthy. In between checkups, there are plenty of things you can do on your own. Experts have done a lot of research about which lifestyle changes and preventive measures are most likely to keep you healthy. Ask your health care provider for more information. WEIGHT AND DIET  Eat a healthy diet  Be sure to include plenty of vegetables, fruits, low-fat dairy products, and lean protein.  Do not eat a lot of foods high in solid fats, added sugars, or salt.  Get regular exercise. This is one of the most important things you can do for your health.  Most adults should exercise for at least 150 minutes each week. The exercise should increase your heart rate and make you sweat (moderate-intensity exercise).  Most adults should also do strengthening exercises at least twice a week. This  is in addition to the moderate-intensity exercise.  Maintain a healthy weight  Body mass index (BMI) is a measurement that can be used to identify possible weight problems. It estimates body fat based on height and weight. Your health care provider can help determine your BMI and help you achieve or maintain a healthy weight.  For females 20 years of age and older:   A BMI below 18.5 is considered underweight.  A BMI of 18.5 to 24.9 is normal.  A BMI of 25 to 29.9 is considered overweight.  A BMI of 30 and above is considered obese.  Watch levels of cholesterol and blood lipids  You should start having your blood tested for lipids and cholesterol at 37 years of age, then have this test every 5 years.  You may need to have your cholesterol levels checked more often if:  Your lipid or cholesterol levels are high.  You are older than 37 years of age.  You are at high risk for heart disease.  CANCER SCREENING   Lung Cancer  Lung cancer screening is recommended for adults 55-80 years old who are at high risk for lung cancer because of a history of smoking.  A yearly low-dose CT scan of the lungs is recommended for people who:  Currently smoke.  Have quit within the past 15 years.  Have at least a 30-pack-year history of smoking. A pack year is smoking an average of one pack of cigarettes a day for 1 year.  Yearly screening should continue until it has been   15 years since you quit.  Yearly screening should stop if you develop a health problem that would prevent you from having lung cancer treatment.  Breast Cancer  Practice breast self-awareness. This means understanding how your breasts normally appear and feel.  It also means doing regular breast self-exams. Let your health care provider know about any changes, no matter how small.  If you are in your 20s or 30s, you should have a clinical breast exam (CBE) by a health care provider every 1-3 years as part of a  regular health exam.  If you are 40 or older, have a CBE every year. Also consider having a breast X-ray (mammogram) every year.  If you have a family history of breast cancer, talk to your health care provider about genetic screening.  If you are at high risk for breast cancer, talk to your health care provider about having an MRI and a mammogram every year.  Breast cancer gene (BRCA) assessment is recommended for women who have family members with BRCA-related cancers. BRCA-related cancers include:  Breast.  Ovarian.  Tubal.  Peritoneal cancers.  Results of the assessment will determine the need for genetic counseling and BRCA1 and BRCA2 testing. Cervical Cancer Your health care provider may recommend that you be screened regularly for cancer of the pelvic organs (ovaries, uterus, and vagina). This screening involves a pelvic examination, including checking for microscopic changes to the surface of your cervix (Pap test). You may be encouraged to have this screening done every 3 years, beginning at age 21.  For women ages 30-65, health care providers may recommend pelvic exams and Pap testing every 3 years, or they may recommend the Pap and pelvic exam, combined with testing for human papilloma virus (HPV), every 5 years. Some types of HPV increase your risk of cervical cancer. Testing for HPV may also be done on women of any age with unclear Pap test results.  Other health care providers may not recommend any screening for nonpregnant women who are considered low risk for pelvic cancer and who do not have symptoms. Ask your health care provider if a screening pelvic exam is right for you.  If you have had past treatment for cervical cancer or a condition that could lead to cancer, you need Pap tests and screening for cancer for at least 20 years after your treatment. If Pap tests have been discontinued, your risk factors (such as having a new sexual partner) need to be reassessed to  determine if screening should resume. Some women have medical problems that increase the chance of getting cervical cancer. In these cases, your health care provider may recommend more frequent screening and Pap tests. Colorectal Cancer  This type of cancer can be detected and often prevented.  Routine colorectal cancer screening usually begins at 37 years of age and continues through 37 years of age.  Your health care provider may recommend screening at an earlier age if you have risk factors for colon cancer.  Your health care provider may also recommend using home test kits to check for hidden blood in the stool.  A small camera at the end of a tube can be used to examine your colon directly (sigmoidoscopy or colonoscopy). This is done to check for the earliest forms of colorectal cancer.  Routine screening usually begins at age 50.  Direct examination of the colon should be repeated every 5-10 years through 37 years of age. However, you may need to be screened more often if   early forms of precancerous polyps or small growths are found. Skin Cancer  Check your skin from head to toe regularly.  Tell your health care provider about any new moles or changes in moles, especially if there is a change in a mole's shape or color.  Also tell your health care provider if you have a mole that is larger than the size of a pencil eraser.  Always use sunscreen. Apply sunscreen liberally and repeatedly throughout the day.  Protect yourself by wearing long sleeves, pants, a wide-brimmed hat, and sunglasses whenever you are outside. HEART DISEASE, DIABETES, AND HIGH BLOOD PRESSURE   High blood pressure causes heart disease and increases the risk of stroke. High blood pressure is more likely to develop in:  People who have blood pressure in the high end of the normal range (130-139/85-89 mm Hg).  People who are overweight or obese.  People who are African American.  If you are 18-39 years of  age, have your blood pressure checked every 3-5 years. If you are 40 years of age or older, have your blood pressure checked every year. You should have your blood pressure measured twice--once when you are at a hospital or clinic, and once when you are not at a hospital or clinic. Record the average of the two measurements. To check your blood pressure when you are not at a hospital or clinic, you can use:  An automated blood pressure machine at a pharmacy.  A home blood pressure monitor.  If you are between 55 years and 79 years old, ask your health care provider if you should take aspirin to prevent strokes.  Have regular diabetes screenings. This involves taking a blood sample to check your fasting blood sugar level.  If you are at a normal weight and have a low risk for diabetes, have this test once every three years after 37 years of age.  If you are overweight and have a high risk for diabetes, consider being tested at a younger age or more often. PREVENTING INFECTION  Hepatitis B  If you have a higher risk for hepatitis B, you should be screened for this virus. You are considered at high risk for hepatitis B if:  You were born in a country where hepatitis B is common. Ask your health care provider which countries are considered high risk.  Your parents were born in a high-risk country, and you have not been immunized against hepatitis B (hepatitis B vaccine).  You have HIV or AIDS.  You use needles to inject street drugs.  You live with someone who has hepatitis B.  You have had sex with someone who has hepatitis B.  You get hemodialysis treatment.  You take certain medicines for conditions, including cancer, organ transplantation, and autoimmune conditions. Hepatitis C  Blood testing is recommended for:  Everyone born from 1945 through 1965.  Anyone with known risk factors for hepatitis C. Sexually transmitted infections (STIs)  You should be screened for sexually  transmitted infections (STIs) including gonorrhea and chlamydia if:  You are sexually active and are younger than 37 years of age.  You are older than 37 years of age and your health care provider tells you that you are at risk for this type of infection.  Your sexual activity has changed since you were last screened and you are at an increased risk for chlamydia or gonorrhea. Ask your health care provider if you are at risk.  If you do not have HIV, but   are at risk, it may be recommended that you take a prescription medicine daily to prevent HIV infection. This is called pre-exposure prophylaxis (PrEP). You are considered at risk if:  You are sexually active and do not regularly use condoms or know the HIV status of your partner(s).  You take drugs by injection.  You are sexually active with a partner who has HIV. Talk with your health care provider about whether you are at high risk of being infected with HIV. If you choose to begin PrEP, you should first be tested for HIV. You should then be tested every 3 months for as long as you are taking PrEP.  PREGNANCY   If you are premenopausal and you may become pregnant, ask your health care provider about preconception counseling.  If you may become pregnant, take 400 to 800 micrograms (mcg) of folic acid every day.  If you want to prevent pregnancy, talk to your health care provider about birth control (contraception). OSTEOPOROSIS AND MENOPAUSE   Osteoporosis is a disease in which the bones lose minerals and strength with aging. This can result in serious bone fractures. Your risk for osteoporosis can be identified using a bone density scan.  If you are 68 years of age or older, or if you are at risk for osteoporosis and fractures, ask your health care provider if you should be screened.  Ask your health care provider whether you should take a calcium or vitamin D supplement to lower your risk for osteoporosis.  Menopause may have  certain physical symptoms and risks.  Hormone replacement therapy may reduce some of these symptoms and risks. Talk to your health care provider about whether hormone replacement therapy is right for you.  HOME CARE INSTRUCTIONS   Schedule regular health, dental, and eye exams.  Stay current with your immunizations.   Do not use any tobacco products including cigarettes, chewing tobacco, or electronic cigarettes.  If you are pregnant, do not drink alcohol.  If you are breastfeeding, limit how much and how often you drink alcohol.  Limit alcohol intake to no more than 1 drink per day for nonpregnant women. One drink equals 12 ounces of beer, 5 ounces of wine, or 1 ounces of hard liquor.  Do not use street drugs.  Do not share needles.  Ask your health care provider for help if you need support or information about quitting drugs.  Tell your health care provider if you often feel depressed.  Tell your health care provider if you have ever been abused or do not feel safe at home.   This information is not intended to replace advice given to you by your health care provider. Make sure you discuss any questions you have with your health care provider.   Document Released: 07/02/2011 Document Revised: 01/07/2015 Document Reviewed: 11/18/2013 Elsevier Interactive Patient Education [employer].

## 2016-10-29 ENCOUNTER — Other Ambulatory Visit: Payer: BLUE CROSS/BLUE SHIELD | Admitting: Family Medicine

## 2016-10-29 DIAGNOSIS — Z0000 Encounter for general adult medical examination without abnormal findings: Secondary | ICD-10-CM

## 2016-10-29 LAB — COMPREHENSIVE METABOLIC PANEL
ALBUMIN: 4.8 g/dL (ref 3.6–5.1)
ALK PHOS: 50 U/L (ref 33–115)
ALT: 18 U/L (ref 6–29)
AST: 25 U/L (ref 10–30)
BILIRUBIN TOTAL: 0.7 mg/dL (ref 0.2–1.2)
BUN: 8 mg/dL (ref 7–25)
CALCIUM: 9.7 mg/dL (ref 8.6–10.2)
CO2: 26 mmol/L (ref 20–31)
CREATININE: 0.68 mg/dL (ref 0.50–1.10)
Chloride: 104 mmol/L (ref 98–110)
Glucose, Bld: 79 mg/dL (ref 65–99)
Potassium: 4.1 mmol/L (ref 3.5–5.3)
SODIUM: 139 mmol/L (ref 135–146)
Total Protein: 7.7 g/dL (ref 6.1–8.1)

## 2016-10-29 LAB — LIPID PANEL
CHOL/HDL RATIO: 2 ratio (ref <=5.0)
CHOLESTEROL: 99 mg/dL — AB (ref 125–200)
HDL: 50 mg/dL (ref >=46)
LDL Cholesterol: 37 mg/dL (ref <130)
Triglycerides: 59 mg/dL (ref <150)
VLDL: 12 mg/dL (ref <30)

## 2016-10-29 LAB — CBC
HCT: 43.1 % (ref 35.0–45.0)
Hemoglobin: 13.4 g/dL (ref 11.7–15.5)
MCH: 28.5 pg (ref 27.0–33.0)
MCHC: 31.1 g/dL — AB (ref 32.0–36.0)
MCV: 91.5 fL (ref 80.0–100.0)
MPV: 11.4 fL (ref 7.5–12.5)
PLATELETS: 188 10*3/uL (ref 140–400)
RBC: 4.71 MIL/uL (ref 3.80–5.10)
RDW: 13.8 % (ref 11.0–15.0)
WBC: 5.7 10*3/uL (ref 3.8–10.8)

## 2016-10-29 LAB — TSH: TSH: 0.79 mIU/L

## 2016-10-31 ENCOUNTER — Encounter: Payer: Self-pay | Admitting: Family Medicine

## 2017-03-13 NOTE — Progress Notes (Signed)
Lab only 

## 2017-08-01 ENCOUNTER — Ambulatory Visit (INDEPENDENT_AMBULATORY_CARE_PROVIDER_SITE_OTHER): Payer: BLUE CROSS/BLUE SHIELD

## 2017-08-01 ENCOUNTER — Encounter: Payer: Self-pay | Admitting: Emergency Medicine

## 2017-08-01 ENCOUNTER — Ambulatory Visit (INDEPENDENT_AMBULATORY_CARE_PROVIDER_SITE_OTHER): Payer: Self-pay | Admitting: Emergency Medicine

## 2017-08-01 VITALS — BP 98/66 | HR 76 | Temp 98.4°F | Resp 16 | Ht 68.5 in | Wt 117.6 lb

## 2017-08-01 DIAGNOSIS — M25512 Pain in left shoulder: Secondary | ICD-10-CM

## 2017-08-01 DIAGNOSIS — S43402A Unspecified sprain of left shoulder joint, initial encounter: Secondary | ICD-10-CM

## 2017-08-01 MED ORDER — DICLOFENAC SODIUM 75 MG PO TBEC: 75.0000 mg | DELAYED_RELEASE_TABLET | Freq: Two times a day (BID) | ORAL | 0 refills | Status: DC

## 2017-08-01 MED ORDER — KETOROLAC TROMETHAMINE 60 MG/2ML IM SOLN
60.0000 mg | Freq: Once | INTRAMUSCULAR | Status: AC
  Administered 2017-08-01: 60 mg via INTRAMUSCULAR

## 2017-08-01 MED ORDER — TRAMADOL HCL 50 MG PO TABS: 50.0000 mg | ORAL_TABLET | Freq: Three times a day (TID) | ORAL | 0 refills | Status: DC | PRN

## 2017-08-01 NOTE — Patient Instructions (Addendum)
     IF you received an x-ray today, you will receive an invoice from [facility]. Please contact [facility] at [phone] with questions or concerns regarding your invoice.   IF you received labwork today, you will receive an invoice from [facility]. Please contact [facility] at [phone] with questions or concerns regarding your invoice.   Our billing staff will not be able to assist you with questions regarding bills from these companies.  You will be contacted with the lab results as soon as they are available. The fastest way to get your results is to activate your My Chart account. Instructions are located on the last page of this paperwork. If you have not heard from us regarding the results in 2 weeks, please contact this office.     Shoulder Pain Many things can cause shoulder pain, including:  An injury.  Moving the arm in the same way again and again (overuse).  Joint pain (arthritis).  Follow these instructions at home: Take these actions to help with your pain:  Squeeze a soft ball or a foam pad as much as you can. This helps to prevent swelling. It also makes the arm stronger.  Take over-the-counter and prescription medicines only as told by your doctor.  If told, put ice on the area: ? Put ice in a plastic bag. ? Place a towel between your skin and the bag. ? Leave the ice on for 20 minutes, 2-3 times per day. Stop putting on ice if it does not help with the pain.  If you were given a shoulder sling or immobilizer: ? Wear it as told. ? Remove it to shower or bathe. ? Move your arm as little as possible. ? Keep your hand moving. This helps prevent swelling.  Contact a doctor if:  Your pain gets worse.  Medicine does not help your pain.  You have new pain in your arm, hand, or fingers. Get help right away if:  Your arm, hand, or fingers: ? Tingle. ? Are numb. ? Are swollen. ? Are painful. ? Turn white or blue. This information is  not intended to replace advice given to you by your health care provider. Make sure you discuss any questions you have with your health care provider. Document Released: 06/04/2008 Document Revised: 08/12/2016 Document Reviewed: 04/11/2015 Elsevier Interactive Patient Education  2018 [employer]  

## 2017-08-01 NOTE — Progress Notes (Signed)
Suzanne Wade 36 y.o.   Chief Complaint  Patient presents with  . Shoulder Pain    LEFT x 2 1/2 weeks per patient down to hands and Left temporal area    HISTORY OF PRESENT ILLNESS: This is a 36 y.o. female complaining of severe pain to left shoulder for 2-3 weeks but worse today keeping her from using left arm.  Shoulder Pain   The pain is present in the left shoulder. This is a recurrent problem. The current episode started 1 to 4 weeks ago. There has been no history of extremity trauma. The problem occurs constantly. The problem has been waxing and waning. The quality of the pain is described as aching. The pain is at a severity of 6/10. The pain is moderate. Associated symptoms include an inability to bear weight, numbness, stiffness and tingling. Pertinent negatives include no fever, joint locking or joint swelling. The symptoms are aggravated by activity. She has tried NSAIDS, OTC ointments and OTC pain meds for the symptoms. The treatment provided no relief.     Prior to Admission medications   Medication Sig Start Date End Date Taking? Authorizing Provider  BIOTIN PO Take 1 tablet by mouth daily. Reported on 03/02/2016   Yes [provider]  Cyanocobalamin (B-12 PO) Take 1 tablet by mouth daily.    Yes [provider]  ferrous fumarate (HEMOCYTE - 106 MG FE) 325 (106 FE) MG TABS tablet Take 1 tablet by mouth.   Yes [provider]  Multiple Vitamin (MULTIVITAMIN) tablet Take 1 tablet by mouth daily.   Yes [provider]  Prenatal Vit-Fe Fumarate-FA (PRENATAL MULTIVITAMIN) TABS tablet Take 1 tablet by mouth daily at 12 noon. Reported on 03/02/2016   Yes [provider]  pseudoephedrine (SUDAFED) 30 MG tablet Take 30 mg by mouth every 4 (four) hours as needed for congestion.   Yes [provider]  esomeprazole (NEXIUM) 20 MG capsule Take 20 mg by mouth daily at 12 noon.    [provider]  ibuprofen (ADVIL,MOTRIN)  200 MG tablet Take 200 mg by mouth every 6 (six) hours as needed for moderate pain.    [provider]  traMADol (ULTRAM) 50 MG tablet Take 1 tablet (50 mg total) by mouth every 8 (eight) hours as needed. Patient not taking: Reported on 08/01/2017 03/02/16   Tilden Fossaees, Elizabeth, MD    Allergies  Allergen Reactions  . Azithromycin Anaphylaxis  . Nickel     Breaks out, red rash  . Z-Pak [Azithromycin]     Closed throat up    Patient Active Problem List   Diagnosis Date Noted  . Syncope and collapse 02/22/2014  . Anxiety state, unspecified 02/22/2014    Past Medical History:  Diagnosis Date  . Allergy   . Anemia   . Anxiety    panic- was out on FMLA  . Anxiety   . CHF (congestive heart failure) (HCC)   . Depression   . Heart murmur   . Neuromuscular disorder (HCC)   . Panic attacks   . Scoliosis   . Ulcer     Past Surgical History:  Procedure Laterality Date  . CESAREAN SECTION      Social History   Social History  . Marital status: Married    Spouse name: N/A  . Number of children: N/A  . Years of education: N/A   Occupational History  . Life ins/ Mortgage    Social History Main Topics  . Smoking status: Former  Smoker  . Smokeless tobacco: Never Used  . Alcohol use 0.0 oz/week     Comment: 2 drinks  . Drug use: No  . Sexual activity: Not on file   Other Topics Concern  . Not on file   Social History Narrative   ** Merged History Encounter **       ** Data from: 06/03/14 Enc Dept: [facility]       ** Data from: 06/04/14 Enc Dept: [facility]   Married. Education: [employer]. Exercise: yes.    Family History  Problem Relation Age of Onset  . Hypertension Mother   . Heart disease Sister   . Hyperlipidemia Sister   . Hypertension Sister   . Cancer Sister        brain  . Cancer Maternal Grandmother        uterine  . Heart disease Maternal Grandmother   . Hyperlipidemia Maternal Grandmother   . Hypertension Maternal Grandmother    . Stroke Maternal Grandmother   . Cancer Maternal Grandfather        lung  . Heart disease Maternal Grandfather   . Hyperlipidemia Maternal Grandfather   . Hypertension Maternal Grandfather   . Stroke Maternal Grandfather      Review of Systems  Constitutional: Negative for chills, fever and weight loss.  HENT: Negative.   Eyes: Negative.   Respiratory: Negative for cough, hemoptysis and shortness of breath.   Cardiovascular: Negative for chest pain, palpitations and leg swelling.  Gastrointestinal: Negative for abdominal pain, nausea and vomiting.  Genitourinary: Negative for dysuria and hematuria.  Musculoskeletal: Positive for joint pain (left shoulder) and stiffness.  Skin: Negative.  Negative for rash.  Neurological: Positive for tingling and numbness. Negative for dizziness and headaches.  Endo/Heme/Allergies: Negative.   All other systems reviewed and are negative.     Vitals:   08/01/17 1155  BP: 98/66  Pulse: 76  Resp: 16  Temp: 98.4 F (36.9 C)    Physical Exam  Constitutional: She is oriented to person, place, and time. She appears well-developed and well-nourished.  HENT:  Head: Normocephalic and atraumatic.  Mouth/Throat: Oropharynx is clear and moist.  Eyes: Pupils are equal, round, and reactive to light. EOM are normal.  Neck: No JVD present. Muscular tenderness present. No spinous process tenderness present. Decreased range of motion present. No thyromegaly present.    Cardiovascular: Normal rate, regular rhythm and normal heart sounds.   Pulmonary/Chest: Effort normal and breath sounds normal.  Abdominal: Soft. She exhibits no distension. There is no tenderness.  Musculoskeletal:  Left shoulder: LROM, tender with trapezius muscle spasm LUE: NVI; hurts to move arm  Lymphadenopathy:    She has no cervical adenopathy.  Neurological: She is alert and oriented to person, place, and time. No sensory deficit. She exhibits normal muscle tone.  Skin:  Skin is warm and dry. Capillary refill takes less than 2 seconds. No rash noted.  Psychiatric: She has a normal mood and affect. Her behavior is normal.  Vitals reviewed.  Dg Shoulder Left  Result Date: 08/01/2017 CLINICAL DATA:  Left shoulder pain.  No known injury. EXAM: LEFT SHOULDER - 2+ VIEW COMPARISON:  None. FINDINGS: There is no evidence of fracture or dislocation. There is no evidence of arthropathy or other focal bone abnormality. Soft tissues are unremarkable. IMPRESSION: Normal exam. Electronically Signed   By: Drusilla Kanner M.D.   On: 08/01/2017 12:41     ASSESSMENT & PLAN:  Romana was seen  today for shoulder pain.  Diagnoses and all orders for this visit:  Acute pain of left shoulder -     DG Shoulder Left; Future -     ketorolac (TORADOL) injection 60 mg; Inject 2 mLs (60 mg total) into the muscle once.  Sprain of left shoulder, unspecified shoulder sprain type, initial encounter  Other orders -     traMADol (ULTRAM) 50 MG tablet; Take 1 tablet (50 mg total) by mouth every 8 (eight) hours as needed. -     diclofenac (VOLTAREN) 75 MG EC tablet; Take 1 tablet (75 mg total) by mouth 2 (two) times daily.    Patient Instructions       IF you received an x-ray today, you will receive an invoice from [facility]. Please contact [facility] at [phone] with questions or concerns regarding your invoice.   IF you received labwork today, you will receive an invoice from [facility]. Please contact [facility] at [phone] with questions or concerns regarding your invoice.   Our billing staff will not be able to assist you with questions regarding bills from these companies.  You will be contacted with the lab results as soon as they are available. The fastest way to get your results is to activate your My Chart account. Instructions are located on the last page of this paperwork. If you have not heard from us regarding the results in 2 weeks, please  contact this office.     Shoulder Pain Many things can cause shoulder pain, including:  An injury.  Moving the arm in the same way again and again (overuse).  Joint pain (arthritis).  Follow these instructions at home: Take these actions to help with your pain:  Squeeze a soft ball or a foam pad as much as you can. This helps to prevent swelling. It also makes the arm stronger.  Take over-the-counter and prescription medicines only as told by your doctor.  If told, put ice on the area: ? Put ice in a plastic bag. ? Place a towel between your skin and the bag. ? Leave the ice on for 20 minutes, 2-3 times per day. Stop putting on ice if it does not help with the pain.  If you were given a shoulder sling or immobilizer: ? Wear it as told. ? Remove it to shower or bathe. ? Move your arm as little as possible. ? Keep your hand moving. This helps prevent swelling.  Contact a doctor if:  Your pain gets worse.  Medicine does not help your pain.  You have new pain in your arm, hand, or fingers. Get help right away if:  Your arm, hand, or fingers: ? Tingle. ? Are numb. ? Are swollen. ? Are painful. ? Turn white or blue. This information is not intended to replace advice given to you by your health care provider. Make sure you discuss any questions you have with your health care provider. Document Released: 06/04/2008 Document Revised: 08/12/2016 Document Reviewed: 04/11/2015 Elsevier Interactive Patient Education  2018 [employer]     Edwina BarthMiguel Elius Etheredge, MD [facility]

## 2018-04-17 DIAGNOSIS — N760 Acute vaginitis: Secondary | ICD-10-CM | POA: Diagnosis not present

## 2018-04-17 DIAGNOSIS — R102 Pelvic and perineal pain: Secondary | ICD-10-CM | POA: Diagnosis not present

## 2018-04-30 DIAGNOSIS — N643 Galactorrhea not associated with childbirth: Secondary | ICD-10-CM | POA: Diagnosis not present

## 2018-04-30 DIAGNOSIS — R1031 Right lower quadrant pain: Secondary | ICD-10-CM | POA: Diagnosis not present

## 2018-05-07 DIAGNOSIS — R102 Pelvic and perineal pain: Secondary | ICD-10-CM | POA: Diagnosis not present

## 2018-05-07 DIAGNOSIS — M545 Low back pain: Secondary | ICD-10-CM | POA: Diagnosis not present

## 2018-05-07 DIAGNOSIS — M62838 Other muscle spasm: Secondary | ICD-10-CM | POA: Diagnosis not present

## 2018-05-21 DIAGNOSIS — M62838 Other muscle spasm: Secondary | ICD-10-CM | POA: Diagnosis not present

## 2018-05-21 DIAGNOSIS — R102 Pelvic and perineal pain: Secondary | ICD-10-CM | POA: Diagnosis not present

## 2018-05-21 DIAGNOSIS — M545 Low back pain: Secondary | ICD-10-CM | POA: Diagnosis not present

## 2018-05-28 DIAGNOSIS — M62838 Other muscle spasm: Secondary | ICD-10-CM | POA: Diagnosis not present

## 2018-05-28 DIAGNOSIS — M545 Low back pain: Secondary | ICD-10-CM | POA: Diagnosis not present

## 2018-05-28 DIAGNOSIS — R102 Pelvic and perineal pain: Secondary | ICD-10-CM | POA: Diagnosis not present

## 2018-06-04 DIAGNOSIS — R102 Pelvic and perineal pain: Secondary | ICD-10-CM | POA: Diagnosis not present

## 2018-06-04 DIAGNOSIS — M545 Low back pain: Secondary | ICD-10-CM | POA: Diagnosis not present

## 2018-06-04 DIAGNOSIS — M62838 Other muscle spasm: Secondary | ICD-10-CM | POA: Diagnosis not present

## 2018-06-11 DIAGNOSIS — M545 Low back pain: Secondary | ICD-10-CM | POA: Diagnosis not present

## 2018-06-11 DIAGNOSIS — M62838 Other muscle spasm: Secondary | ICD-10-CM | POA: Diagnosis not present

## 2018-06-11 DIAGNOSIS — R102 Pelvic and perineal pain: Secondary | ICD-10-CM | POA: Diagnosis not present

## 2018-06-18 DIAGNOSIS — R102 Pelvic and perineal pain: Secondary | ICD-10-CM | POA: Diagnosis not present

## 2018-06-18 DIAGNOSIS — M545 Low back pain: Secondary | ICD-10-CM | POA: Diagnosis not present

## 2018-06-18 DIAGNOSIS — M62838 Other muscle spasm: Secondary | ICD-10-CM | POA: Diagnosis not present

## 2018-06-25 DIAGNOSIS — R102 Pelvic and perineal pain: Secondary | ICD-10-CM | POA: Diagnosis not present

## 2018-06-25 DIAGNOSIS — M62838 Other muscle spasm: Secondary | ICD-10-CM | POA: Diagnosis not present

## 2018-06-25 DIAGNOSIS — M545 Low back pain: Secondary | ICD-10-CM | POA: Diagnosis not present

## 2018-07-02 DIAGNOSIS — R102 Pelvic and perineal pain: Secondary | ICD-10-CM | POA: Diagnosis not present

## 2018-07-02 DIAGNOSIS — M545 Low back pain: Secondary | ICD-10-CM | POA: Diagnosis not present

## 2018-07-02 DIAGNOSIS — M62838 Other muscle spasm: Secondary | ICD-10-CM | POA: Diagnosis not present

## 2018-07-16 DIAGNOSIS — R102 Pelvic and perineal pain: Secondary | ICD-10-CM | POA: Diagnosis not present

## 2018-07-16 DIAGNOSIS — M545 Low back pain: Secondary | ICD-10-CM | POA: Diagnosis not present

## 2018-07-16 DIAGNOSIS — M62838 Other muscle spasm: Secondary | ICD-10-CM | POA: Diagnosis not present

## 2018-07-30 DIAGNOSIS — R102 Pelvic and perineal pain: Secondary | ICD-10-CM | POA: Diagnosis not present

## 2018-07-30 DIAGNOSIS — M62838 Other muscle spasm: Secondary | ICD-10-CM | POA: Diagnosis not present

## 2018-07-30 DIAGNOSIS — M545 Low back pain: Secondary | ICD-10-CM | POA: Diagnosis not present

## 2018-08-13 DIAGNOSIS — R102 Pelvic and perineal pain: Secondary | ICD-10-CM | POA: Diagnosis not present

## 2018-08-13 DIAGNOSIS — M545 Low back pain: Secondary | ICD-10-CM | POA: Diagnosis not present

## 2018-08-13 DIAGNOSIS — M62838 Other muscle spasm: Secondary | ICD-10-CM | POA: Diagnosis not present

## 2018-12-27 ENCOUNTER — Encounter (HOSPITAL_COMMUNITY): Payer: Self-pay

## 2018-12-27 ENCOUNTER — Other Ambulatory Visit: Payer: Self-pay

## 2018-12-27 ENCOUNTER — Emergency Department (HOSPITAL_COMMUNITY)
Admission: EM | Admit: 2018-12-27 | Discharge: 2018-12-27 | Disposition: A | Discharge status: Home / Self Care | Payer: 59 | Attending: Emergency Medicine | Admitting: Emergency Medicine

## 2018-12-27 ENCOUNTER — Emergency Department (HOSPITAL_COMMUNITY): Payer: 59

## 2018-12-27 DIAGNOSIS — I509 Heart failure, unspecified: Secondary | ICD-10-CM | POA: Diagnosis not present

## 2018-12-27 DIAGNOSIS — Z79899 Other long term (current) drug therapy: Secondary | ICD-10-CM | POA: Diagnosis not present

## 2018-12-27 DIAGNOSIS — F17200 Nicotine dependence, unspecified, uncomplicated: Secondary | ICD-10-CM | POA: Diagnosis not present

## 2018-12-27 DIAGNOSIS — J0100 Acute maxillary sinusitis, unspecified: Secondary | ICD-10-CM | POA: Insufficient documentation

## 2018-12-27 DIAGNOSIS — R0981 Nasal congestion: Secondary | ICD-10-CM | POA: Diagnosis not present

## 2018-12-27 HISTORY — DX: Other pulmonary collapse: J98.19

## 2018-12-27 MED ORDER — AMOXICILLIN-POT CLAVULANATE 875-125 MG PO TABS: 1.0000 | ORAL_TABLET | Freq: Two times a day (BID) | ORAL | 0 refills | Status: AC

## 2018-12-27 MED ORDER — NAPROXEN 500 MG PO TABS: 500.0000 mg | ORAL_TABLET | Freq: Two times a day (BID) | ORAL | 0 refills | Status: DC

## 2018-12-27 NOTE — ED Provider Notes (Signed)
[facility]-EMERGENCY DEPT Provider Note   CSN: [id number] Arrival date & time: 12/27/18  1435   History   Chief Complaint Chief Complaint  Patient presents with  . nasal congestion    HPI Suzanne Wade is a 37 y.o. female with a PMH of CHF, anemia, and anxiety presents with constant nasal congestion and productive cough onset 2 weeks ago. Patient reports symptoms have worsened over the last 2-3 days. Patient reports left sided facial edema, left sided otalgia, and left sided eye pain onset 2 days ago. Patient describes symptoms as a throbbing and states she has had yellow drainage from her nose. Patient reports she has tried Mucinex, Exedrin, and warm compresses without relief. Patient states symptoms are worse with laying down and nothing makes the symptoms better. Patient reports chest tightness with cough, but denies fever, shortness of breath, or chest pain. Patient reports associated sore throat and left sided headache. Patient reports intermittent diarrhea, but denies abdominal pain or vomiting.  HPI  Past Medical History:  Diagnosis Date  . Allergy   . Anemia   . Anxiety    panic- was out on FMLA  . Anxiety   . CHF (congestive heart failure) (HCC)   . Depression   . Heart murmur   . Lung collapse   . Neuromuscular disorder (HCC)   . Panic attacks   . Scoliosis   . Ulcer     Patient Active Problem List   Diagnosis Date Noted  . Acute pain of left shoulder 08/01/2017  . Sprain of shoulder, left 08/01/2017  . Syncope and collapse 02/22/2014  . Anxiety state, unspecified 02/22/2014    Past Surgical History:  Procedure Laterality Date  . CESAREAN SECTION       OB History    Gravida  6   Para  3   Term      Preterm      AB      Living        SAB      TAB      Ectopic      Multiple      Live Births               Home Medications    Prior to Admission medications   Medication Sig Start Date End Date Taking?  Authorizing Provider  amoxicillin-clavulanate (AUGMENTIN) 875-125 MG tablet Take 1 tablet by mouth 2 (two) times daily for 14 days. 12/27/18 01/10/19  Carlyle BasquesHernandez, Revanth Neidig P, PA-C  BIOTIN PO Take 1 tablet by mouth daily. Reported on 03/02/2016    [provider]  Cyanocobalamin (B-12 PO) Take 1 tablet by mouth daily.     [provider]  diclofenac (VOLTAREN) 75 MG EC tablet Take 1 tablet (75 mg total) by mouth 2 (two) times daily. 08/01/17   Georgina QuintSagardia, Miguel Jose, MD  esomeprazole (NEXIUM) 20 MG capsule Take 20 mg by mouth daily at 12 noon.    [provider]  ferrous fumarate (HEMOCYTE - 106 MG FE) 325 (106 FE) MG TABS tablet Take 1 tablet by mouth.    [provider]  ibuprofen (ADVIL,MOTRIN) 200 MG tablet Take 200 mg by mouth every 6 (six) hours as needed for moderate pain.    [provider]  Multiple Vitamin (MULTIVITAMIN) tablet Take 1 tablet by mouth daily.    [provider]  naproxen (NAPROSYN) 500 MG tablet Take 1 tablet (500 mg total) by mouth 2 (two) times daily. 12/27/18  Malayshia All P, PA-C  Prenatal Vit-Fe Fumarate-FA (PRENATAL MULTIVITAMIN) TABS tablet Take 1 tablet by mouth daily at 12 noon. Reported on 03/02/2016    [provider]  pseudoephedrine (SUDAFED) 30 MG tablet Take 30 mg by mouth every 4 (four) hours as needed for congestion.    [provider]  traMADol (ULTRAM) 50 MG tablet Take 1 tablet (50 mg total) by mouth every 8 (eight) hours as needed. 08/01/17   Georgina Quint, MD    Family History Family History  Problem Relation Age of Onset  . Hypertension Mother   . Heart disease Sister   . Hyperlipidemia Sister   . Hypertension Sister   . Cancer Sister        brain  . Cancer Maternal Grandmother        uterine  . Heart disease Maternal Grandmother   . Hyperlipidemia Maternal Grandmother   . Hypertension Maternal Grandmother   . Stroke Maternal Grandmother   . Cancer Maternal Grandfather         lung  . Heart disease Maternal Grandfather   . Hyperlipidemia Maternal Grandfather   . Hypertension Maternal Grandfather   . Stroke Maternal Grandfather     Social History Social History   Tobacco Use  . Smoking status: Current Some Day Smoker  . Smokeless tobacco: Never Used  Substance Use Topics  . Alcohol use: Yes    Alcohol/week: 0.0 standard drinks    Comment: 2 drinks  . Drug use: No     Allergies   Azithromycin; Nickel; and Z-pak [azithromycin]   Review of Systems Review of Systems  Constitutional: Negative for activity change, appetite change, fatigue and fever.  HENT: Positive for congestion, rhinorrhea and sore throat. Negative for ear pain and postnasal drip.   Eyes: Negative for pain, redness and itching.  Respiratory: Positive for cough and chest tightness. Negative for shortness of breath.   Cardiovascular: Negative for chest pain and leg swelling.  Gastrointestinal: Positive for diarrhea. Negative for abdominal pain, nausea and vomiting.  Musculoskeletal: Positive for myalgias. Negative for gait problem, neck pain and neck stiffness.  Skin: Positive for color change. Negative for rash.  Allergic/Immunologic: Negative for environmental allergies.  Neurological: Positive for headaches. Negative for dizziness, speech difficulty and weakness.  Hematological: Positive for adenopathy.     Physical Exam Updated Vital Signs BP (!) 131/95 (BP Location: Left Arm)   Pulse 84   Temp 98.7 F (37.1 C) (Oral)   Resp 16   Ht 5\' 8"  (1.727 m)   Wt 52.2 kg   LMP 12/07/2018   SpO2 100%   BMI 17.49 kg/m   Physical Exam Vitals signs and nursing note reviewed.  Constitutional:      General: She is not in acute distress.    Appearance: She is well-developed. She is not diaphoretic.  HENT:     Head: Normocephalic and atraumatic.     Comments: Left sided facial edema noted on exam. Left sided facial tenderness to palpation. Left sided birth mark noted on exam, but  patient states birth mark is brighter than usual.     Right Ear: Tympanic membrane, ear canal and external ear normal. No middle ear effusion.     Left Ear: Tympanic membrane, ear canal and external ear normal.  No middle ear effusion.     Nose: Mucosal edema, congestion and rhinorrhea present.     Mouth/Throat:     Mouth: Mucous membranes are dry.     Pharynx: Uvula midline.  Posterior oropharyngeal erythema present. No oropharyngeal exudate.  Eyes:     General:        Right eye: No discharge.        Left eye: No discharge.     Extraocular Movements: Extraocular movements intact.     Conjunctiva/sclera: Conjunctivae normal.     Pupils: Pupils are equal, round, and reactive to light.  Neck:     Musculoskeletal: Normal range of motion and neck supple. No neck rigidity or muscular tenderness.  Cardiovascular:     Rate and Rhythm: Normal rate and regular rhythm.     Heart sounds: Normal heart sounds. No murmur. No friction rub. No gallop.   Pulmonary:     Effort: Pulmonary effort is normal. No respiratory distress.     Breath sounds: Normal breath sounds. No wheezing or rales.  Abdominal:     Palpations: Abdomen is soft.     Tenderness: There is no abdominal tenderness.  Musculoskeletal: Normal range of motion.        General: No swelling or tenderness.  Lymphadenopathy:     Cervical: Cervical adenopathy present.  Skin:    General: Skin is warm.     Findings: No rash.  Neurological:     Mental Status: She is alert and oriented to person, place, and time.  Psychiatric:        Mood and Affect: Mood normal.      ED Treatments / Results  Labs (all labs ordered are listed, but only abnormal results are displayed) Labs Reviewed - No data to display  EKG None  Radiology Dg Chest 2 View  Result Date: 12/27/2018 CLINICAL DATA:  Nasal congestion with mucus and body aches. EXAM: CHEST - 2 VIEW COMPARISON:  03/08/2016 FINDINGS: The heart size and mediastinal contours are within  normal limits. Both lungs are clear. Mild dextroconvex curvature of the upper lumbar spine, apex at approximately L3. The visualized skeletal structures are unremarkable. IMPRESSION: No active cardiopulmonary disease. Electronically Signed   By: Tollie Eth M.D.   On: 12/27/2018 16:38    Procedures Procedures (including critical care time)  Medications Ordered in ED Medications - No data to display   Initial Impression / Assessment and Plan / ED Course  I have reviewed the triage vital signs and the nursing notes.  Pertinent labs & imaging results that were available during my care of the patient were reviewed by me and considered in my medical decision making (see chart for details).  Clinical Course as of Dec 28 1743  Sat Dec 27, 2018  1715 No active cardiopulmonary disease.  DG Chest 2 View [AH]    Clinical Course User Index [AH] Carlyle Basques P, PA-C   Pt CXR negative for acute infiltrate. Patients symptoms are consistent with bacterial maxillary sinusitis due to history and physical exam. Will prescribe Augmentin and naproxen for pain. Pt will be discharged with symptomatic treatment.  Verbalizes understanding and is agreeable with plan. Pt is hemodynamically stable & in NAD prior to dc.   Final Clinical Impressions(s) / ED Diagnoses   Final diagnoses:  Acute non-recurrent maxillary sinusitis    ED Discharge Orders         Ordered    amoxicillin-clavulanate (AUGMENTIN) 875-125 MG tablet  2 times daily     12/27/18 1743    naproxen (NAPROSYN) 500 MG tablet  2 times daily     12/27/18 1743           Leretha Dykes, [place] 12/27/18 1745  Raeford RazorKohut, Stephen, MD 12/29/18 1524

## 2018-12-27 NOTE — ED Triage Notes (Signed)
Patient c/o nasal congestin with yellow mucous, generalized body aches, left otalgia and left facial pain since yesterday.

## 2018-12-27 NOTE — Discharge Instructions (Addendum)
You have been seen today for nasal congestion. Please read and follow all provided instructions.   1. Medications: augmentin (antibiotic), naproxen for pain, usual home medications 2. Treatment: rest, drink plenty of fluids 3. Follow Up: Please follow up with your primary doctor in 2 days for discussion of your diagnoses and further evaluation after today's visit; if you do not have a primary care doctor use the resource guide provided to find one; Please return to the ER for any new or worsening symptoms. Please obtain all of your results from medical records or have your doctors office obtain the results - share them with your doctor - you should be seen at your doctors office. Call today to arrange your follow up.   Take medications as prescribed. Please review all of the medicines and only take them if you do not have an allergy to them. Return to the emergency room for worsening condition or new concerning symptoms. Follow up with your regular doctor. If you don't have a regular doctor use one of the numbers below to establish a primary care doctor.  Please be aware that if you are taking birth control pills, taking other prescriptions, ESPECIALLY ANTIBIOTICS may make the birth control ineffective - if this is the case, either do not engage in sexual activity or use alternative methods of birth control such as condoms until you have finished the medicine and your family doctor says it is OK to restart them. If you are on a blood thinner such as COUMADIN, be aware that any other medicine that you take may cause the coumadin to either work too much, or not enough - you should have your coumadin level rechecked in next 7 days if this is the case.  ?  It is also a possibility that you have an allergic reaction to any of the medicines that you have been prescribed - Everybody reacts differently to medications and while MOST people have no trouble with most medicines, you may have a reaction such as nausea,  vomiting, rash, swelling, shortness of breath. If this is the case, please stop taking the medicine immediately and contact your physician.  ?  You should return to the ER if you develop severe or worsening symptoms.   Emergency Department Resource Guide 1) Find a Doctor and Pay Out of Pocket Although you won't have to find out who is covered by your insurance plan, it is a good idea to ask around and get recommendations. You will then need to call the office and see if the doctor you have chosen will accept you as a new patient and what types of options they offer for patients who are self-pay. Some doctors offer discounts or will set up payment plans for their patients who do not have insurance, but you will need to ask so you aren't surprised when you get to your appointment.  2) Contact Your Local Health Department Not all health departments have doctors that can see patients for sick visits, but many do, so it is worth a call to see if yours does. If you don't know where your local health department is, you can check in your phone book. The CDC also has a tool to help you locate your state's health department, and many state websites also have listings of all of their local health departments.  3) Find a Walk-in Clinic If your illness is not likely to be very severe or complicated, you may want to try a walk in clinic. These  are popping up all over the country in pharmacies, drugstores, and shopping centers. They're usually staffed by nurse practitioners or physician assistants that have been trained to treat common illnesses and complaints. They're usually fairly quick and inexpensive. However, if you have serious medical issues or chronic medical problems, these are probably not your best option.  No Primary Care Doctor: Call Health Connect at  [phone] - they can help you locate a primary care doctor that  accepts your insurance, provides certain services, etc. Physician Referral [phone]  Emergency Department Resource Guide 1) Find a Doctor and Pay Out of Pocket Although you won't have to find out who is covered by your insurance plan, it is a good idea to ask around and get recommendations. You will then need to call the office and see if the doctor you have chosen will accept you as a new patient and what types of options they offer for patients who are self-pay. Some doctors offer discounts or will set up payment plans for their patients who do not have insurance, but you will need to ask so you aren't surprised when you get to your appointment.  2) Contact Your Local Health Department Not all health departments have doctors that can see patients for sick visits, but many do, so it is worth a call to see if yours does. If you don't know where your local health department is, you can check in your phone book. The CDC also has a tool to help you locate your state's health department, and many state websites also have listings of all of their local health departments.  3) Find a [facility] If your illness is not likely to be very severe or complicated, you may want to try a walk in clinic. These are popping up all over the country in pharmacies, drugstores, and shopping centers. They're usually staffed by nurse practitioners or physician assistants that have been trained to treat common illnesses and complaints. They're usually fairly quick and inexpensive. However, if you have serious medical issues or chronic medical problems, these are probably not your best option.  No Primary Care Doctor: Call Health Connect at  [phone] - they can help you locate a primary care doctor that  accepts your insurance, provides certain services, etc. Physician Referral Service- [phone]  Chronic Pain Problems: Organization         Address  Phone   Notes  [facility]  [phone] Patients need to be referred by their primary care doctor.    Medication Assistance: Organization         Address  Phone   Notes  [facility] Medication [facility] [address] [place], [place] [address] [phone] --Must be a resident of [facility] -- Must have NO insurance coverage whatsoever (no Medicaid/ Medicare, etc.) -- The pt. MUST have a primary care doctor that directs their care regularly and follows them in the community   [facility]  [phone]   [employer]  [phone]    Agencies that provide inexpensive medical care: Organization         Address  Phone   Notes  [place]  [phone]   [facility]    [phone]   [facility] [place],  [phone]   [place] [address] [place] [phone]   [facility]anned [facility]    (  [phone]   [facility]    [phone]   [facility]  [address], [place] Phone:  [phone], Fax:  [phone] Hours of Operation:  9 am - 6 pm, M-F.  Also accepts Medicaid/Medicare and self-pay.  [facility] for [place], [address], [place] Phone: [phone], Fax: [phone]. Hours of Operation:  8:30 am - 5:30 pm, M-F.  Also accepts Medicaid and self-pay.  [facility] [place] [address], [place] Phone: [phone]   [place], [place], [place] [phone], Ext. [phone] Mondays & Thursdays: 7-9 AM.  First 15 patients are seen on a first come, first serve basis.    [place] Providers:  Organization         Address  Phone   Notes  [facility] [address], [place] [phone] Also accepts self-pay patients.  [facility] [address], [place]  [phone]   [place], [address], [place] [phone]   [facility] [address] [place] [phone]   Lucianne Lei [address], [place]   [phone] Only accepts [place] [facility] [place] patients after they have their name applied to their card.   Self-Pay (no insurance) in [facility]:  Organization         Address  Phone   Notes  Sickle Cell Patients, [facility] [place] [phone]   [facility] [place] [phone]   [facility] [place]  [place], [place], [place] [phone]   [facility]/Dr. Osei-Bonsu  [address], [place] or [address], [place] [phone] Phone number for both [place] and [place] locations is the same.  [facility] and [facility] [address], [place] [phone]   [facility] [address] [place] or [address] [phone]   [facility] [address] [place] [phone], phone; [phone], fax Sees patients 1st and 3rd Saturday of every month.  Must not qualify for public or private insurance (i.e. Medicaid, Medicare, [place] Health Choice, Veterans' Benefits)  Household income should be no more than 200% of the poverty level The clinic cannot treat you if you are pregnant or think you are pregnant  Sexually transmitted diseases are not treated at the clinic.

## 2018-12-27 NOTE — ED Provider Notes (Signed)
Medical screening examination/treatment/procedure(s) were conducted as a shared visit with non-physician practitioner(s) and myself.  I personally evaluated the patient during the encounter.  None  37yF with L facial pain/swelling. Clinically maxillary sinusitis. Mild L facial swelling. 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 Nontoxic. Poor dentition, but I do not think this is odontologic in origin. Eye exam fine. Plan abx, PRN pain meds, decongestants.    Raeford RazorKohut, Velmer Broadfoot, MD 12/27/18 [phone]

## 2019-01-22 ENCOUNTER — Ambulatory Visit: Payer: Self-pay

## 2019-01-22 NOTE — Telephone Encounter (Signed)
Call placed to patient.  Pt states that she was treated in the ED for a sinus infection 12/27/18.  She was give Augmentin 14 day.  She finished the last dose of antibiotic on Sunday 01/11/2019.  She states that she woke this AM and was experiencing nasal congestion and drainage that is milky white to slightly yellow.  Her nose is not blocked.  She states she has mild pain returning around her nasal area both sides.  Her ears are ringing.  She denies fever.  She has a very mild cough.  Appointment scheduled per protocol.  Care advice read to patient.  Pt verbalized understanding of all instructions. Reason for Disposition . [1] Nasal discharge AND [2] present > 10 days  Answer Assessment - Initial Assessment Questions 1. LOCATION: "Where does it hurt?"      .5 nostril area 2. ONSET: "When did the sinus pain start?"  (e.g., hours, days)      This AM 3. SEVERITY: "How bad is the pain?"   (Scale 1-10; mild, moderate or severe)   - MILD (1-3): doesn't interfere with normal activities    - MODERATE (4-7): interferes with normal activities (e.g., work or school) or awakens from sleep   - SEVERE (8-10): excruciating pain and patient unable to do any normal activities        Mild now around nasal area of face 4. RECURRENT SYMPTOM: "Have you ever had sinus problems before?" If so, ask: "When was the last time?" and "What happened that time?"      yes 5. NASAL CONGESTION: "Is the nose blocked?" If so, ask, "Can you open it or must you breathe through the mouth?"     Runny not blocked 6. NASAL DISCHARGE: "Do you have discharge from your nose?" If so ask, "What color?"     White with some yellow 7. FEVER: "Do you have a fever?" If so, ask: "What is it, how was it measured, and when did it start?"      no 8. OTHER SYMPTOMS: "Do you have any other symptoms?" (e.g., sore throat, cough, earache, difficulty breathing)     Cough ear is ringing 9. PREGNANCY: "Is there any chance you are pregnant?" "When was your  last menstrual period?"   No due 2 days  Protocols used: SINUS PAIN OR CONGESTION-A-AH

## 2019-01-23 ENCOUNTER — Encounter: Payer: Self-pay | Admitting: Osteopathic Medicine

## 2019-01-23 ENCOUNTER — Ambulatory Visit (INDEPENDENT_AMBULATORY_CARE_PROVIDER_SITE_OTHER): Payer: 59 | Admitting: Osteopathic Medicine

## 2019-01-23 ENCOUNTER — Other Ambulatory Visit: Payer: Self-pay

## 2019-01-23 VITALS — BP 114/79 | HR 83 | Temp 98.6°F | Resp 16 | Ht 68.0 in | Wt 120.6 lb

## 2019-01-23 DIAGNOSIS — J329 Chronic sinusitis, unspecified: Secondary | ICD-10-CM | POA: Diagnosis not present

## 2019-01-23 MED ORDER — DOXYCYCLINE HYCLATE 100 MG PO TABS: 100.0000 mg | ORAL_TABLET | Freq: Two times a day (BID) | ORAL | 0 refills | Status: DC

## 2019-01-23 MED ORDER — IPRATROPIUM BROMIDE 0.06 % NA SOLN: 2.0000 | Freq: Four times a day (QID) | NASAL | 1 refills | Status: DC

## 2019-01-23 NOTE — Progress Notes (Signed)
HPI: Normajean BaxterLatisha Banks Dimon is a 38 y.o. female who  has a past medical history of Allergy, Anemia, Anxiety, Anxiety, CHF (congestive heart failure) (HCC), Depression, Heart murmur, Lung collapse, Neuromuscular disorder (HCC), Panic attacks, Scoliosis, and Ulcer.  [facility] today, 01/23/19,  for chief complaint of:  Sinus  . Context: recently seen in ER 12/27/18 (about a month ago), treated for sinusitis w/ augmentin.  . Location: L maxillary area . Quality: can smell foul odor, persistent facial pain  . Severity: worse today . Duration: new pain about 2 days total . Assoc signs/symptoms: no fever. +mild cough       Past medical history, surgical history, and family history reviewed.  Current medication list and allergy/intolerance information reviewed.   (See remainder of HPI, ROS, Phys Exam below)     ASSESSMENT/PLAN:   Recurrent sinusitis   Meds ordered this encounter  Medications  . doxycycline (VIBRA-TABS) 100 MG tablet    Sig: Take 1 tablet (100 mg total) by mouth 2 (two) times daily.    Dispense:  14 tablet    Refill:  0  . ipratropium (ATROVENT) 0.06 % nasal spray    Sig: Place 2 sprays into both nostrils 4 (four) times daily.    Dispense:  15 mL    Refill:  1    Patient Instructions   If the antibiotics and the nasal spray aren't helping, call us - will probably need to get you set up with CT scan of the sinuses +/- ENT referral if not improving as we'd expect.       If you have lab work done today you will be contacted with your lab results within the next 2 weeks.  If you have not heard from us then please contact us. The fastest way to get your results is to register for My Chart.   IF you received an x-ray today, you will receive an invoice from [facility]. Please contact [facility] at [phone] with questions or concerns regarding your invoice.   IF you received labwork today, you will  receive an invoice from [facility]. Please contact [facility] at [phone] with questions or concerns regarding your invoice.   Our billing staff will not be able to assist you with questions regarding bills from these companies.  You will be contacted with the lab results as soon as they are available. The fastest way to get your results is to activate your My Chart account. Instructions are located on the last page of this paperwork. If you have not heard from us regarding the results in 2 weeks, please contact this office.       Follow-up plan: Return if symptoms worsen or fail to improve / routine care w/ Dr Creta LevinStallings as directed .                                                            ############################################ ############################################ ############################################ ############################################    Outpatient Encounter Medications as of 01/23/2019  Medication Sig  . BIOTIN PO Take 1 tablet by mouth daily. Reported on 03/02/2016  . Cyanocobalamin (B-12 PO) Take 1 tablet by mouth daily.   . ferrous fumarate (HEMOCYTE - 106 MG FE) 325 (106 FE) MG TABS tablet Take 1 tablet by mouth.  .Marland Kitchen  Multiple Vitamin (MULTIVITAMIN) tablet Take 1 tablet by mouth daily.  . naproxen (NAPROSYN) 500 MG tablet Take 1 tablet (500 mg total) by mouth 2 (two) times daily.  . Prenatal Vit-Fe Fumarate-FA (PRENATAL MULTIVITAMIN) TABS tablet Take 1 tablet by mouth daily at 12 noon. Reported on 03/02/2016  . traMADol (ULTRAM) 50 MG tablet Take 1 tablet (50 mg total) by mouth every 8 (eight) hours as needed.  . diclofenac (VOLTAREN) 75 MG EC tablet Take 1 tablet (75 mg total) by mouth 2 (two) times daily. (Patient not taking: Reported on 01/23/2019)  . doxycycline (VIBRA-TABS) 100 MG tablet Take 1 tablet (100 mg total) by mouth 2 (two) times daily.  Marland Kitchen esomeprazole (NEXIUM) 20 MG capsule Take 20  mg by mouth daily at 12 noon.  Marland Kitchen ibuprofen (ADVIL,MOTRIN) 200 MG tablet Take 200 mg by mouth every 6 (six) hours as needed for moderate pain.  Marland Kitchen ipratropium (ATROVENT) 0.06 % nasal spray Place 2 sprays into both nostrils 4 (four) times daily.  . pseudoephedrine (SUDAFED) 30 MG tablet Take 30 mg by mouth every 4 (four) hours as needed for congestion.   No facility-administered encounter medications on file as of 01/23/2019.    Allergies  Allergen Reactions  . Azithromycin Anaphylaxis  . Nickel     Breaks out, red rash      Review of Systems:  Constitutional: No recent illness  HEENT: +headache, no vision change, no dental pain   Cardiac: No  chest pain  Respiratory:  No  shortness of breath. +Cough  Gastrointestinal: No  abdominal pain,  Musculoskeletal: No new myalgia/arthralgia  Skin: No  Rash   Exam:  BP 114/79 (BP Location: Right Arm, Patient Position: Sitting, Cuff Size: Normal)   Pulse 83   Temp 98.6 F (37 C) (Oral)   Resp 16   Ht 5\' 8"  (1.727 m)   Wt 120 lb 9.6 oz (54.7 kg)   LMP 01/06/2019   SpO2 97%   BMI 18.34 kg/m   Constitutional: VS see above. General Appearance: alert, well-developed, well-nourished, NAD  Eyes: Normal lids and conjunctive, non-icteric sclera  Ears, Nose, Mouth, Throat: MMM, Normal external inspection ears/nares/mouth/lips/gums.TM normal bilaterally. Nasal congestion more prominent on the L  Neck: No masses, trachea midline. No lymphadenopathy.   Respiratory: Normal respiratory effort.   Musculoskeletal: Gait normal. Symmetric and independent movement of all extremities  Neurological: Normal balance/coordination. No tremor.  Skin: warm, dry, intact.   Psychiatric: Normal judgment/insight. Normal mood and affect. Oriented x3.   Visit summary with medication list and pertinent instructions was printed for patient to review, advised to alert [place] if any changes needed. All questions at time of visit were answered - patient  instructed to contact office with any additional concerns. ER/RTC precautions were reviewed with the patient and understanding verbalized.   Follow-up plan: Return if symptoms worsen or fail to improve / routine care w/ Dr Creta Levin as directed .    Please note: voice recognition software was used to produce this document, and typos may escape review. Please contact Dr. Lyn Hollingshead for any needed clarifications.

## 2019-01-23 NOTE — Patient Instructions (Addendum)
If the antibiotics and the nasal spray aren't helping, call us - will probably need to get you set up with CT scan of the sinuses +/- ENT referral if not improving as we'd expect.       If you have lab work done today you will be contacted with your lab results within the next 2 weeks.  If you have not heard from [place] then please contact us. The fastest way to get your results is to register for My Chart.   IF you received an x-ray today, you will receive an invoice from [facility]. Please contact [facility] at [phone] with questions or concerns regarding your invoice.   IF you received labwork today, you will receive an invoice from [facility]. Please contact [facility] at [phone] with questions or concerns regarding your invoice.   Our billing staff will not be able to assist you with questions regarding bills from these companies.  You will be contacted with the lab results as soon as they are available. The fastest way to get your results is to activate your My Chart account. Instructions are located on the last page of this paperwork. If you have not heard from [place] regarding the results in 2 weeks, please contact this office.

## 2019-02-24 ENCOUNTER — Ambulatory Visit (INDEPENDENT_AMBULATORY_CARE_PROVIDER_SITE_OTHER): Payer: 59 | Admitting: Family Medicine

## 2019-02-24 ENCOUNTER — Encounter: Payer: Self-pay | Admitting: Family Medicine

## 2019-02-24 ENCOUNTER — Other Ambulatory Visit: Payer: Self-pay

## 2019-02-24 VITALS — BP 124/83 | HR 70 | Temp 98.6°F | Resp 18 | Ht 68.0 in | Wt 122.4 lb

## 2019-02-24 DIAGNOSIS — J029 Acute pharyngitis, unspecified: Secondary | ICD-10-CM

## 2019-02-24 LAB — POCT RAPID STREP A (OFFICE): RAPID STREP A SCREEN: NEGATIVE

## 2019-02-24 MED ORDER — AMOXICILLIN 875 MG PO TABS: 875.0000 mg | ORAL_TABLET | Freq: Two times a day (BID) | ORAL | 0 refills | Status: DC

## 2019-02-24 NOTE — Progress Notes (Signed)
Patient ID: Libertie Pleasants, female    DOB: [birth year]  Age: 38 y.o. MRN: [id number]  Chief Complaint  Patient presents with  . Sore Throat    started last night hard to swallow  . Ear Pain    started this morning     Subjective:   Yesterday developed a severe sore throat.  No fever.  Has pink swollen glands in her left side of her neck.  She has pain in her left ear.  Minimal head congestion and cough.  Works at a [employer], sports.  Did get her flu shot this year.  Current allergies, medications, problem list, past/family and social histories reviewed.  Objective:  BP 124/83   Pulse 70   Temp 98.6 F (37 C) (Oral)   Resp 18   Ht 5\' 8"  (1.727 m)   Wt 122 lb 6.4 oz (55.5 kg)   LMP 02/22/2019   SpO2 100%   BMI 18.61 kg/m   Obvious illness, speaks with a very weak voice.  She has facial asymmetry, but that is normal for her from a hemangioma or something.  Both TMs appear normal.  Neck supple with a couple very large nodes under the left side of the neck.  Her throat is mildly erythematous, significantly edematous, and no exudate.  Chest clear.  Heart regular.  Rapid strep test is negative.  Assessment & Plan:   Assessment: 1. Sore throat   2. Acute pharyngitis, unspecified etiology       Plan: Suspicious for strep even though strep screen is negative.  See instructions.  Orders Placed This Encounter  Procedures  . Culture, Group A Strep    Order Specific Question:   Source    Answer:   throat  . POCT rapid strep A    No orders of the defined types were placed in this encounter.        Patient Instructions    Drink plenty of fluids and get enough rest  Tylenol 500 mg 2 pills 3 times daily and/4or ibuprofen 200 mg 3 pills 3 times daily as needed for pain, fever, aching.  Amoxicillin 875 mg 1 twice daily.  If the culture comes back negative you can discontinue and discard the rest of these, but if it is positive continue for the full 10 days.  Return if  worse  Stay off work until at least Thursday.   If you have lab work done today you will be contacted with your lab results within the next 2 weeks.  If you have not heard from [place] then please contact us. The fastest way to get your results is to register for My Chart.   IF you received an x-ray today, you will receive an invoice from [facility]. Please contact [facility] at [phone] with questions or concerns regarding your invoice.   IF you received labwork today, you will receive an invoice from [place]. Please contact [facility] at [phone] with questions or concerns regarding your invoice.   Our billing staff will not be able to assist you with questions regarding bills from these companies.  You will be contacted with the lab results as soon as they are available. The fastest way to get your results is to activate your My Chart account. Instructions are located on the last page of this paperwork. If you have not heard from [place] regarding the results in 2 weeks, please contact this office.        Return if symptoms worsen or fail  to improve.   Janace Hoard, MD 02/24/2019

## 2019-02-24 NOTE — Patient Instructions (Addendum)
  Drink plenty of fluids and get enough rest  Tylenol 500 mg 2 pills 3 times daily and/4or ibuprofen 200 mg 3 pills 3 times daily as needed for pain, fever, aching.  Amoxicillin 875 mg 1 twice daily.  If the culture comes back negative you can discontinue and discard the rest of these, but if it is positive continue for the full 10 days.  Return if worse  Stay off work until at least Thursday.   If you have lab work done today you will be contacted with your lab results within the next 2 weeks.  If you have not heard from [place] then please contact us. The fastest way to get your results is to register for My Chart.   IF you received an x-ray today, you will receive an invoice from [facility]. Please contact [facility] at [phone] with questions or concerns regarding your invoice.   IF you received labwork today, you will receive an invoice from [facility]. Please contact [facility] at [phone] with questions or concerns regarding your invoice.   Our billing staff will not be able to assist you with questions regarding bills from these companies.  You will be contacted with the lab results as soon as they are available. The fastest way to get your results is to activate your My Chart account. Instructions are located on the last page of this paperwork. If you have not heard from [place] regarding the results in 2 weeks, please contact this office.

## 2019-02-24 NOTE — Addendum Note (Signed)
Addended by: HOPPER, DAVID H on: 02/24/2019 04:06 PM   Modules accepted: Orders

## 2019-02-26 LAB — CULTURE, GROUP A STREP: Strep A Culture: NEGATIVE

## 2019-03-03 ENCOUNTER — Encounter: Payer: Self-pay | Admitting: Radiology

## 2019-03-04 DIAGNOSIS — Z30431 Encounter for routine checking of intrauterine contraceptive device: Secondary | ICD-10-CM | POA: Diagnosis not present

## 2019-03-21 ENCOUNTER — Other Ambulatory Visit: Payer: Self-pay | Admitting: Osteopathic Medicine

## 2019-03-21 DIAGNOSIS — J329 Chronic sinusitis, unspecified: Secondary | ICD-10-CM

## 2019-03-22 NOTE — Telephone Encounter (Signed)
Requested medication (s) are due for refill today: yes  Requested medication (s) are on the active medication list: yes  Last refill:  Last refilled on 01/23/19  Future visit scheduled: No  Notes to clinic:  Unable to refill per protocol    Requested Prescriptions  Pending Prescriptions Disp Refills   ipratropium (ATROVENT) 0.06 % nasal spray [Pharmacy Med Name: IPRATROPIUM 0.06% SPRAY] 15 mL 1    Sig: Place 2 sprays into both nostrils 4 (four) times daily.     Off-Protocol Failed - 03/21/2019 10:41 AM      Failed - Medication not assigned to a protocol, review manually.      Failed - Valid encounter within last 12 months    Recent Outpatient Visits          3 weeks ago Acute pharyngitis, unspecified etiology   Primary Care at [facility], Sandria Bales, MD   1 month ago Recurrent sinusitis   Primary Care at [facility], [place], DO   1 year ago Acute pain of left shoulder   Primary Care at [facility], Eilleen Kempf, MD   2 years ago Annual physical exam   Primary Care at [facility], [place], MD   3 years ago Chest pain, unspecified chest pain type   Primary Care at [facility], MD

## 2019-03-23 ENCOUNTER — Other Ambulatory Visit: Payer: Self-pay

## 2019-03-27 DIAGNOSIS — Z01419 Encounter for gynecological examination (general) (routine) without abnormal findings: Secondary | ICD-10-CM | POA: Diagnosis not present

## 2019-03-27 DIAGNOSIS — Z13220 Encounter for screening for lipoid disorders: Secondary | ICD-10-CM | POA: Diagnosis not present

## 2019-03-27 DIAGNOSIS — Z130 Encounter for screening for diseases of the blood and blood-forming organs and certain disorders involving the immune mechanism: Secondary | ICD-10-CM | POA: Diagnosis not present

## 2019-03-27 DIAGNOSIS — Z131 Encounter for screening for diabetes mellitus: Secondary | ICD-10-CM | POA: Diagnosis not present

## 2019-03-27 DIAGNOSIS — Z681 Body mass index (BMI) 19 or less, adult: Secondary | ICD-10-CM | POA: Diagnosis not present

## 2019-03-27 DIAGNOSIS — Z0000 Encounter for general adult medical examination without abnormal findings: Secondary | ICD-10-CM | POA: Diagnosis not present

## 2019-04-14 ENCOUNTER — Other Ambulatory Visit: Payer: Self-pay | Admitting: Osteopathic Medicine

## 2019-04-14 DIAGNOSIS — J329 Chronic sinusitis, unspecified: Secondary | ICD-10-CM

## 2019-05-19 DIAGNOSIS — Z1231 Encounter for screening mammogram for malignant neoplasm of breast: Secondary | ICD-10-CM | POA: Diagnosis not present

## 2019-06-01 ENCOUNTER — Other Ambulatory Visit: Payer: Self-pay | Admitting: Obstetrics and Gynecology

## 2019-06-02 ENCOUNTER — Telehealth (INDEPENDENT_AMBULATORY_CARE_PROVIDER_SITE_OTHER): Payer: 59 | Admitting: Family Medicine

## 2019-06-02 ENCOUNTER — Other Ambulatory Visit: Payer: Self-pay

## 2019-06-02 ENCOUNTER — Ambulatory Visit: Payer: Self-pay | Admitting: *Deleted

## 2019-06-02 DIAGNOSIS — Z7189 Other specified counseling: Secondary | ICD-10-CM

## 2019-06-02 NOTE — Progress Notes (Signed)
Virtual Visit via Telephone Note  I connected with Suzanne Wade on 06/02/19 at 2:39 PM by telephone and verified that I am speaking with the correct person using two identifiers.   I discussed the limitations, risks, security and privacy concerns of performing an evaluation and management service by telephone and the availability of in person appointments. I also discussed with the patient that there may be a patient responsible charge related to this service. The patient expressed understanding and agreed to proceed, consent obtained  Chief complaint: covid testing.    History of Present Illness: Suzanne Wade is a 38 y.o. female  Daughter had arm fracture, went to be seen by Ortho last week 5/27 for follow up appt with her father (pt's husband)  Staff member reportedly was diagnosed with Covid. All staff members were wearing mask. dtr and husband were wearing masks.Call Sunday that one of the staff tested positive for Covid. Daughter and husband went to [facility] for testing.  Was advised by testing site that she should be tested as well due to close proximity.  Husband, dtr, and patient are without symptoms. Has history of pneumothorax in 2017, and heart arrhythmia. Works from home.  No fever, no cough/dyspnea/n/v/d. No dyspnea.  Feels well otherwise. Does not feel sick.    Patient Active Problem List   Diagnosis Date Noted  . Recurrent sinusitis 01/23/2019  . Acute pain of left shoulder 08/01/2017  . Sprain of shoulder, left 08/01/2017  . Syncope and collapse 02/22/2014  . Anxiety state, unspecified 02/22/2014   Past Medical History:  Diagnosis Date  . Allergy   . Anemia   . Anxiety    panic- was out on FMLA  . Anxiety   . CHF (congestive heart failure) (HCC)   . Depression   . Heart murmur   . Lung collapse   . Neuromuscular disorder (HCC)   . Panic attacks   . Scoliosis   . Ulcer    Past Surgical History:  Procedure Laterality Date   . CESAREAN SECTION     Allergies  Allergen Reactions  . Azithromycin Anaphylaxis  . Nickel     Breaks out, red rash   Prior to Admission medications   Medication Sig Start Date End Date Taking? Authorizing Provider  BIOTIN PO Take 1 tablet by mouth daily. Reported on 03/02/2016   Yes [provider]  Cyanocobalamin (B-12 PO) Take 1 tablet by mouth daily.    Yes [provider]  esomeprazole (NEXIUM) 20 MG capsule Take 20 mg by mouth daily at 12 noon.   Yes [provider]  ferrous fumarate (HEMOCYTE - 106 MG FE) 325 (106 FE) MG TABS tablet Take 1 tablet by mouth.   Yes [provider]  ipratropium (ATROVENT) 0.06 % nasal spray PLACE 2 SPRAYS INTO BOTH NOSTRILS 4 (FOUR) TIMES DAILY. 03/23/19  Yes Doristine Bosworth, MD  Multiple Vitamin (MULTIVITAMIN) tablet Take 1 tablet by mouth daily.   Yes [provider]  Prenatal Vit-Fe Fumarate-FA (PRENATAL MULTIVITAMIN) TABS tablet Take 1 tablet by mouth daily at 12 noon. Reported on 03/02/2016   Yes [provider]  diclofenac (VOLTAREN) 75 MG EC tablet Take 1 tablet (75 mg total) by mouth 2 (two) times daily. Patient not taking: Reported on 06/02/2019 08/01/17   Georgina Quint, MD   Social History   Socioeconomic History  . Marital status: Married    Spouse name: Not on file  . Number of children: Not  on file  . Years of education: Not on file  . Highest education level: Not on file  Occupational History  . 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 Financial resource strain: Not on file  . Food insecurity:    Worry: Not on file    Inability: Not on file  . Transportation needs:    Medical: Not on file    Non-medical: Not on file  Tobacco Use  . Smoking status: Current Some Day Smoker  . Smokeless tobacco: Never Used  Substance and Sexual Activity  . Alcohol use: Yes    Alcohol/week: 0.0 standard drinks    Comment: 2 drinks  . Drug use: No  . Sexual activity: Not on file   Lifestyle  . Physical activity:    Days per week: Not on file    Minutes per session: Not on file  . Stress: Not on file  Relationships  . Social connections:    Talks on phone: Not on file    Gets together: Not on file    Attends religious service: Not on file    Active member of club or organization: Not on file    Attends meetings of clubs or organizations: Not on file    Relationship status: Not on file  . Intimate partner violence:    Fear of current or ex partner: Not on file    Emotionally abused: Not on file    Physically abused: Not on file    Forced sexual activity: Not on file  Other Topics Concern  . Not on file  Social History Narrative   ** Merged History Encounter **       ** Data from: 06/03/14 Enc Dept: [facility]       ** Data from: 06/04/14 Enc Dept: [facility]   Married. Education: [employer]. Exercise: yes.     Observations/Objective:   Assessment and Plan: No diagnosis found.   Follow Up Instructions: tbd  At this time as she does not have any symptoms of infection, and the 2 family members are also asymptomatic, does not appear to have indication for COVID-19 testing at this time.  Staff member that her family was exposed to was apparently wearing mask as were her family members.  If either family member tests positive, or if she develops any symptoms, would order test immediately.  Members should have test results tomorrow.  I discussed the assessment and treatment plan with the patient. The patient was provided an opportunity to ask questions and all were answered. The patient agreed with the plan and demonstrated an understanding of the instructions.   The patient was advised to call back or seek an in-person evaluation if the symptoms worsen or if the condition fails to improve as anticipated.  I provided 17 minutes of non-face-to-face time during this encounter.  Signed,   Meredith StaggersJeffrey Drema Eddington, MD Primary [facility] at [facility].  06/02/19

## 2019-06-02 NOTE — Telephone Encounter (Addendum)
Pt called requesting COVID testing; she says that her husband and daughter were tested on 06/01/2019 due to possible exposure at a physician's office; the pt is not having SOB, fever or cough;  she also says that she needs paperwork stating that it is ok for her to work from home; pt advised to take temp twice daily, and monitor for symptoms; recommendations made per nurse triage protocol; she verbalizes understanding; pt transferred to [place] at [place] for scheduling; will route to office for notification.    Reason for Disposition . [1] Mild body aches, chills, diarrhea, headache, runny nose, or sore throat AND [2] within 14 days of COVID-19 EXPOSURE  Answer Assessment - Initial Assessment Questions 1. CLOSE CONTACT: "Who is the person with the confirmed or suspected COVID-19 infection that you were exposed to?"     Pt's daughter and husband 2. PLACE of CONTACT: "Where were you when you were exposed to COVID-19?" (e.g., home, school, medical waiting room; which city?)    home 3. TYPE of CONTACT: "How much contact was there?" (e.g., sitting next to, live in same house, work in same office, same building)     Same home 4. DURATION of CONTACT: "How long were you in contact with the COVID-19 patient?" (e.g., a few seconds, passed by person, a few minutes, live with the patient)    Lives with pt 5. DATE of CONTACT: "When did you have contact with a COVID-19 patient?" (e.g., how many days ago)     Ongoing; lives with suspected 6. TRAVEL: "Have you traveled out of the country recently?" If so, "When and where?"     * Also ask about out-of-state travel, since the CDC has identified some high risk cities for community spread in the [place].     * Note: Travel becomes less relevant if there is widespread community transmission where the patient lives.     major 7. COMMUNITY SPREAD: "Are there lots of cases of COVID-19 (community spread) where you live?" (See public health department website, if unsure)   * MAJOR  community spread: high number of cases; numbers of cases are increasing; many people hospitalized.   * MINOR community spread: low number of cases; not increasing; few or no people hospitalized     major 8. SYMPTOMS: "Do you have any symptoms?" (e.g., fever, cough, breathing difficulty)    none 9. PREGNANCY OR POSTPARTUM: "Is there any chance you are pregnant?" "When was your last menstrual period?" "Did you deliver in the last 2 weeks?"   No LMP 06/02/2019 10. HIGH RISK: "Do you have any heart or lung problems? Do you have a weak immune system?" (e.g., CHF, COPD, asthma, HIV positive, chemotherapy, renal failure, diabetes mellitus, sickle cell anemia)       Leaky heart valve, history of right lung collapse  Protocols used: CORONAVIRUS (COVID-19) EXPOSURE-A-AH

## 2019-06-02 NOTE — Patient Instructions (Addendum)
At this point, I would remain at home, avoid contact with others until results of family members testing is known, and then can determine if you need a test as well.   If you have lab work done today you will be contacted with your lab results within the next 2 weeks.  If you have not heard from [place] then please contact us. The fastest way to get your results is to register for My Chart.   IF you received an x-ray today, you will receive an invoice from [facility]. Please contact [facility] at [phone] with questions or concerns regarding your invoice.   IF you received labwork today, you will receive an invoice from [facility]. Please contact [facility] at [phone] with questions or concerns regarding your invoice.   Our billing staff will not be able to assist you with questions regarding bills from these companies.  You will be contacted with the lab results as soon as they are available. The fastest way to get your results is to activate your My Chart account. Instructions are located on the last page of this paperwork. If you have not heard from [place] regarding the results in 2 weeks, please contact this office.

## 2019-06-02 NOTE — Progress Notes (Signed)
CC- Husband and daughter was seen at a DR office  Sun evening got a call that someone at the office was dx with covid and was told to go to the testing center to get test for covid. They also told me to let wife know that she may need to be tested since she has been around husband and daughter. Patient was informed she may or may not be tested since she did not meet the criteria. We may need to wait to see what their test results come in before we test her since she was not having any symptoms. But we will inform Dr Neva Seat and leave that decision to Dr Neva Seat if she should be tested.

## 2019-06-02 NOTE — Telephone Encounter (Signed)
Patient has apt with Dr. Neva Seat today.

## 2019-06-03 ENCOUNTER — Encounter: Payer: Self-pay | Admitting: Family Medicine

## 2019-06-04 NOTE — Telephone Encounter (Signed)
Good morning Dr. Neva Seat,   Ms. Contois is requesting a return to work note.  I am happy to provide one for her but, prior to writing it, would you please confirm if any recommendations were made for her to stay out of work/quarantine while waiting for the test results.   Thank you,  Doyne Keel

## 2020-04-06 IMAGING — CR DG CHEST 2V
2 series · 2 of 2 positions shown · non-contrast
Comparison: 03/08/2016

CLINICAL DATA: Nasal congestion with mucus and body aches.

EXAM:
CHEST - 2 VIEW

[w chest pa]
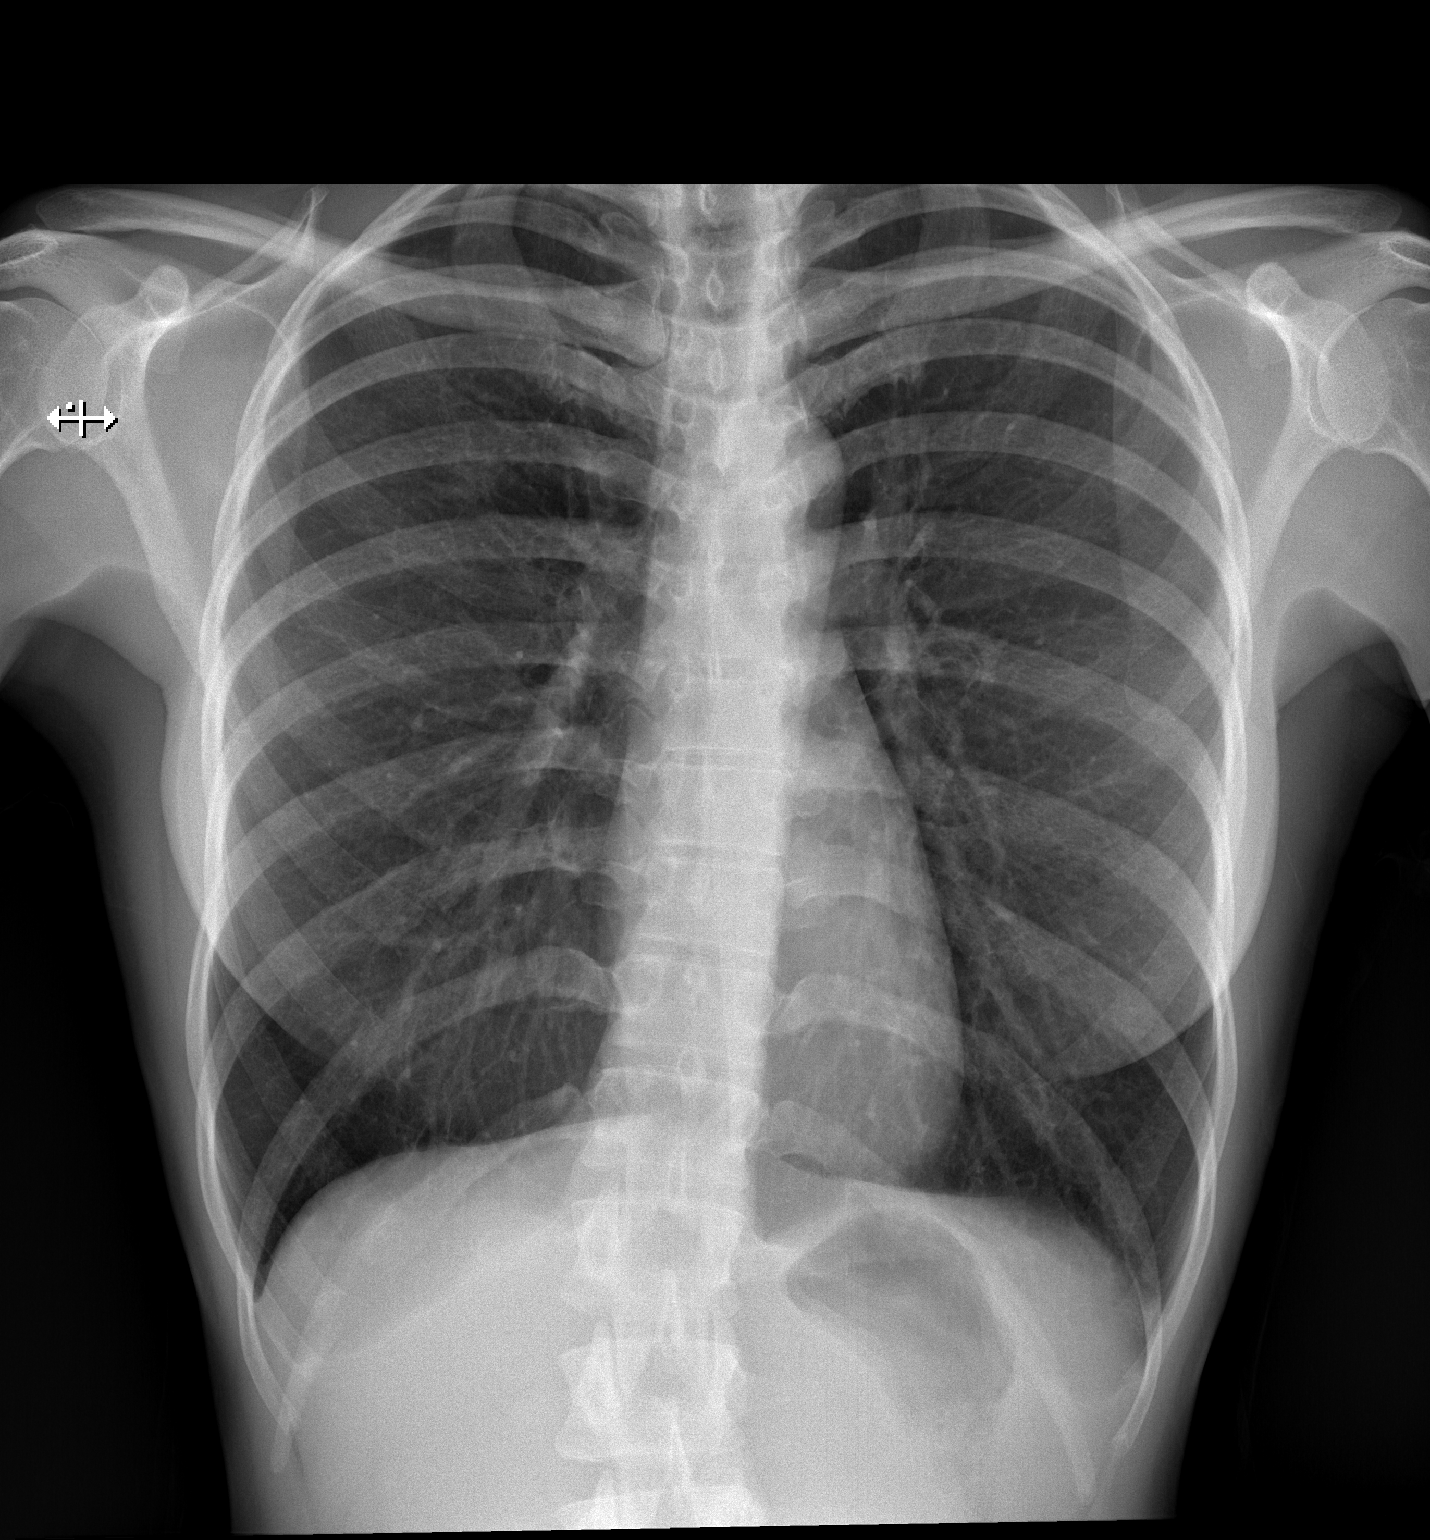

[w chest lat]
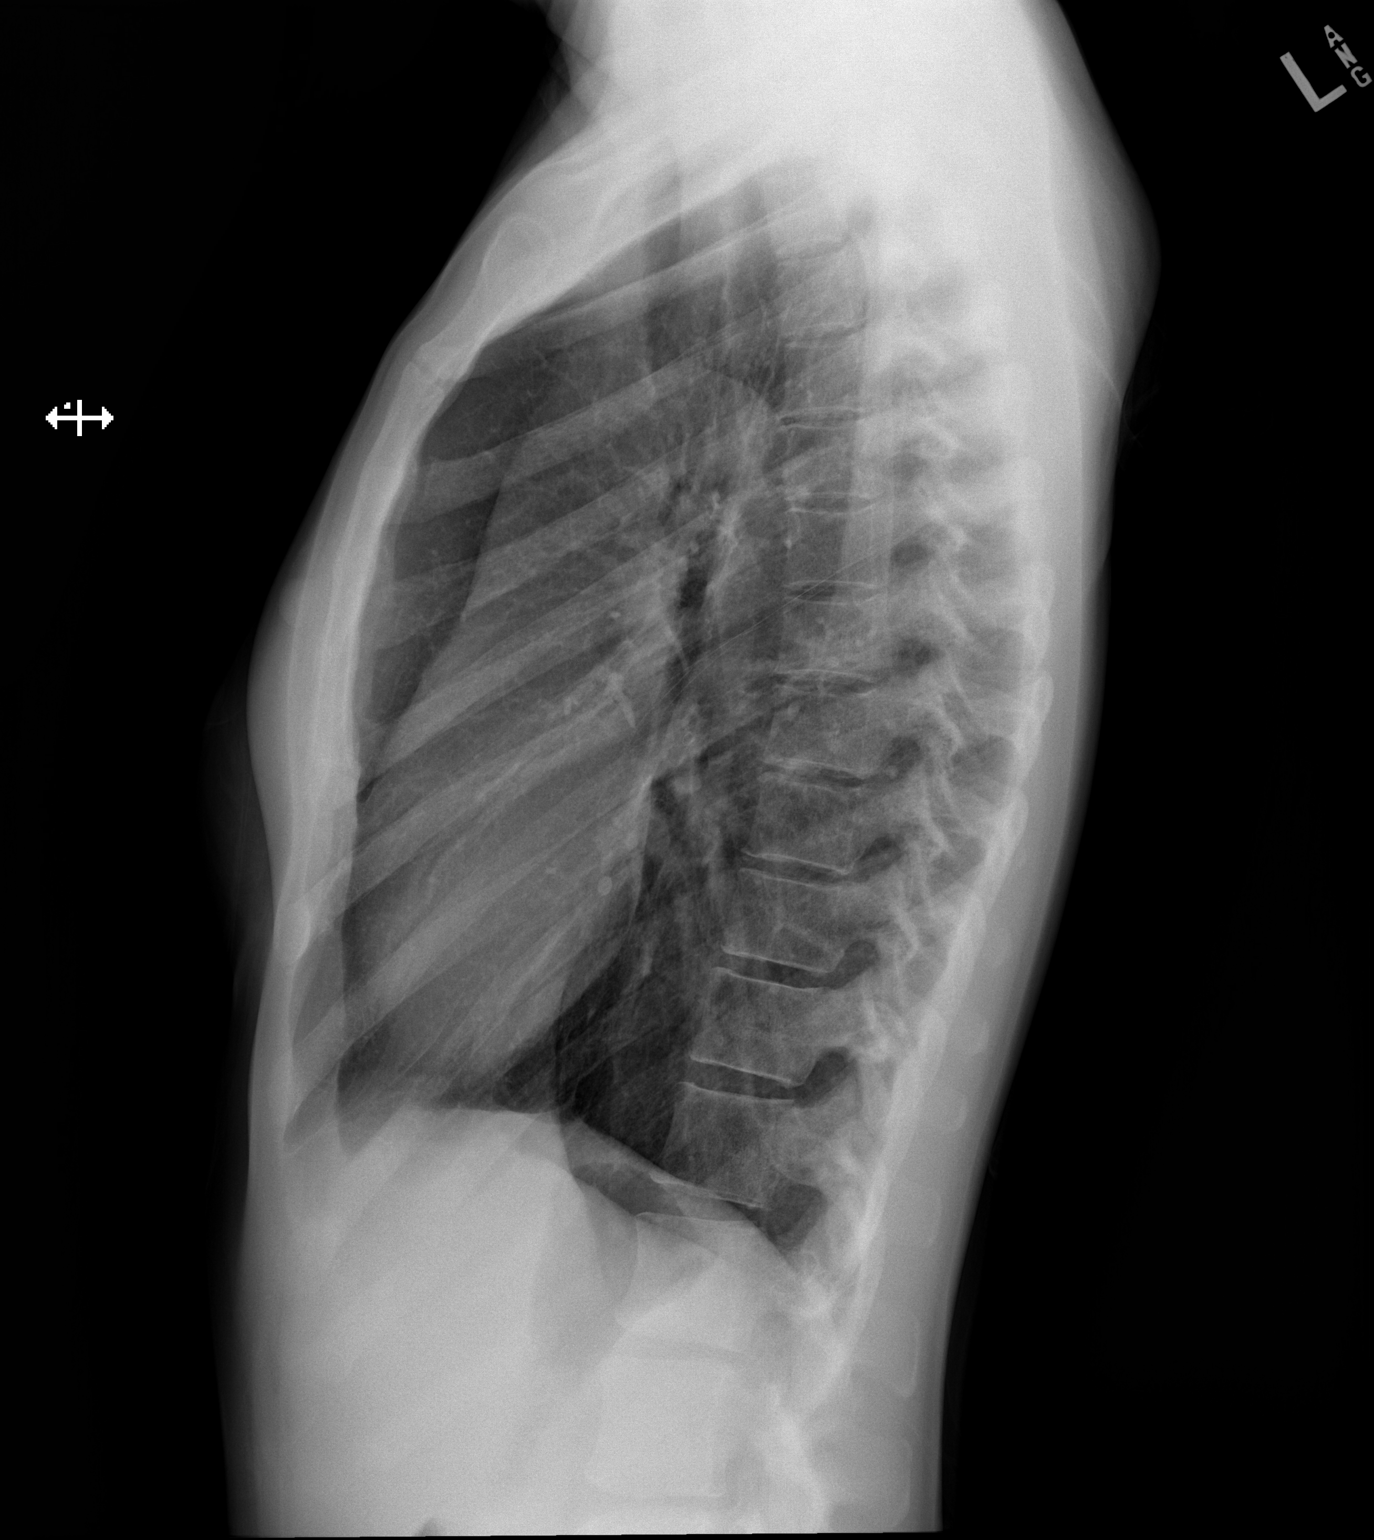

[2 of 2 positions shown; findings below may reference images not displayed]

FINDINGS: The heart size and mediastinal contours are within normal limits.
Both lungs are clear. Mild dextroconvex curvature of the upper
lumbar spine, apex at approximately L3. The visualized skeletal
structures are unremarkable.
IMPRESSION: No active cardiopulmonary disease.

## 2020-08-10 ENCOUNTER — Other Ambulatory Visit: Payer: Self-pay

## 2020-08-10 ENCOUNTER — Ambulatory Visit (INDEPENDENT_AMBULATORY_CARE_PROVIDER_SITE_OTHER): Payer: No Typology Code available for payment source | Admitting: Registered Nurse

## 2020-08-10 VITALS — BP 127/74 | HR 83 | Temp 98.2°F | Ht 68.0 in | Wt 120.2 lb

## 2020-08-10 DIAGNOSIS — R300 Dysuria: Secondary | ICD-10-CM | POA: Diagnosis not present

## 2020-08-10 DIAGNOSIS — N3000 Acute cystitis without hematuria: Secondary | ICD-10-CM | POA: Diagnosis not present

## 2020-08-10 LAB — POCT URINALYSIS DIP (MANUAL ENTRY)
Bilirubin, UA: NEGATIVE
Blood, UA: NEGATIVE
Glucose, UA: NEGATIVE mg/dL
Ketones, POC UA: NEGATIVE mg/dL
Nitrite, UA: POSITIVE — AB
Protein Ur, POC: NEGATIVE mg/dL
Spec Grav, UA: 1.025 (ref 1.010–1.025)
Urobilinogen, UA: 0.2 E.U./dL
pH, UA: 5.5 (ref 5.0–8.0)

## 2020-08-10 MED ORDER — SULFAMETHOXAZOLE-TRIMETHOPRIM 800-160 MG PO TABS: 1.0000 | ORAL_TABLET | Freq: Two times a day (BID) | ORAL | 0 refills | Status: DC

## 2020-08-10 MED ORDER — PHENAZOPYRIDINE HCL 95 MG PO TABS: 95.0000 mg | ORAL_TABLET | Freq: Three times a day (TID) | ORAL | 0 refills | Status: DC | PRN

## 2020-08-10 NOTE — Patient Instructions (Signed)
° ° ° °  If you have lab work done today you will be contacted with your lab results within the next 2 weeks.  If you have not heard from us then please contact us. The fastest way to get your results is to register for My Chart. ° ° °IF you received an x-ray today, you will receive an invoice from [facility]. Please contact [facility] at [phone] with questions or concerns regarding your invoice.  ° °IF you received labwork today, you will receive an invoice from [facility]. Please contact [facility] at [phone] with questions or concerns regarding your invoice.  ° °Our billing staff will not be able to assist you with questions regarding bills from these companies. ° °You will be contacted with the lab results as soon as they are available. The fastest way to get your results is to activate your My Chart account. Instructions are located on the last page of this paperwork. If you have not heard from us regarding the results in 2 weeks, please contact this office. °  ° ° ° °

## 2020-08-11 LAB — URINE CULTURE: Organism ID, Bacteria: NO GROWTH

## 2020-09-13 ENCOUNTER — Ambulatory Visit: Payer: Self-pay | Attending: Internal Medicine

## 2020-09-13 DIAGNOSIS — Z23 Encounter for immunization: Secondary | ICD-10-CM

## 2020-09-13 NOTE — Progress Notes (Signed)
   Covid-19 Vaccination Clinic  Name:  Suzanne Wade    MRN: [id number] DOB: [birth year]  09/13/2020  Suzanne Wade was observed post Covid-19 immunization for 30 minutes based on pre-vaccination screening without incident. She was provided with Vaccine Information Sheet and instruction to access the V-Safe system.   Suzanne Wade was instructed to call 911 with any severe reactions post vaccine: Marland Kitchen Difficulty breathing  . Swelling of face and throat  . A fast heartbeat  . A bad rash all over body  . Dizziness and weakness   Immunizations Administered    Name Date Dose VIS Date Route   Pfizer COVID-19 Vaccine 09/13/2020  9:52 AM 0.3 mL 02/24/2019 Intramuscular   Manufacturer: [employer], [employer]   Lot: [id number]   NDC: [id number]

## 2020-09-28 ENCOUNTER — Encounter: Payer: Self-pay | Admitting: Registered Nurse

## 2020-09-28 NOTE — Progress Notes (Signed)
Acute Office Visit  Subjective:    Patient ID: Suzanne Wade, female    DOB: [birth year], 39 y.o.   MRN: [id number]  Chief Complaint  Patient presents with   Back Pain    Pt stated that she has been having some back pain on the Lt side for the past 3 days along with frequent urination.    HPI Patient is in today for L flank pain and frequent urination for 3 days Onset relatively abrupt No preceding event No hx of UTI No new sexual partners Denies consitutional symptoms Denies vaginal symptoms No dysuria, hematuria, hesitancy, or decreased urination  Past Medical History:  Diagnosis Date   Allergy    Anemia    Anxiety    panic- was out on FMLA   Anxiety    CHF (congestive heart failure) (HCC)    Depression    Heart murmur    Lung collapse    Neuromuscular disorder (HCC)    Panic attacks    Scoliosis    Ulcer     Past Surgical History:  Procedure Laterality Date   CESAREAN SECTION      Family History  Problem Relation Age of Onset   Hypertension Mother    Heart disease Sister    Hyperlipidemia Sister    Hypertension Sister    Cancer Sister        brain   Cancer Maternal Grandmother        uterine   Heart disease Maternal Grandmother    Hyperlipidemia Maternal Grandmother    Hypertension Maternal Grandmother    Stroke Maternal Grandmother    Cancer Maternal Grandfather        lung   Heart disease Maternal Grandfather    Hyperlipidemia Maternal Grandfather    Hypertension Maternal Grandfather    Stroke Maternal Grandfather     Social History   Socioeconomic History   Marital status: Married    Spouse name: Not on file   Number of children: Not on file   Years of education: Not on file   Highest education level: Not on file  Occupational History   Occupation: Life ins/ Mortgage  Tobacco Use   Smoking status: Current Some Day Smoker   Smokeless tobacco: Never Used  Building services engineer Use: Never  used  Substance and Sexual Activity   Alcohol use: Yes    Alcohol/week: 0.0 standard drinks    Comment: 2 drinks   Drug use: No   Sexual activity: Not on file  Other Topics Concern   Not on file  Social History Narrative   ** Merged History Encounter **       ** Data from: 06/03/14 Enc Dept: [facility]       ** Data from: 06/04/14 Enc Dept: [facility]   Married. Education: [employer]. Exercise: yes.   Social Determinants of Health   Financial Resource Strain:    Difficulty of Paying Living Expenses: Not on file  Food Insecurity:    Worried About Programme researcher, broadcasting/film/video in the Last Year: Not on file   [employer] of Food in the Last Year: Not on file  Transportation Needs:    Lack of Transportation (Medical): Not on file   Lack of Transportation (Non-Medical): Not on file  Physical Activity:    Days of Exercise per Week: Not on file   Minutes of Exercise per Session: Not on file  Stress:    Feeling of Stress :  Not on file  Social Connections:    Frequency of Communication with Friends and Family: Not on file   Frequency of Social Gatherings with Friends and Family: Not on file   Attends Religious Services: Not on file   Active Member of Clubs or Organizations: Not on file   Attends Banker Meetings: Not on file   Marital Status: Not on file  Intimate Partner Violence:    Fear of Current or Ex-Partner: Not on file   Emotionally Abused: Not on file   Physically Abused: Not on file   Sexually Abused: Not on file    Outpatient Medications Prior to Visit  Medication Sig Dispense Refill   BIOTIN PO Take 1 tablet by mouth daily. Reported on 03/02/2016     Cyanocobalamin (B-12 PO) Take 1 tablet by mouth daily.      diclofenac (VOLTAREN) 75 MG EC tablet Take 1 tablet (75 mg total) by mouth 2 (two) times daily. 30 tablet 0   esomeprazole (NEXIUM) 20 MG capsule Take 20 mg by mouth daily at 12 noon.     ferrous fumarate (HEMOCYTE - 106  MG FE) 325 (106 FE) MG TABS tablet Take 1 tablet by mouth.     ipratropium (ATROVENT) 0.06 % nasal spray PLACE 2 SPRAYS INTO BOTH NOSTRILS 4 (FOUR) TIMES DAILY. 15 mL 1   Multiple Vitamin (MULTIVITAMIN) tablet Take 1 tablet by mouth daily.     Prenatal Vit-Fe Fumarate-FA (PRENATAL MULTIVITAMIN) TABS tablet Take 1 tablet by mouth daily at 12 noon. Reported on 03/02/2016     No facility-administered medications prior to visit.    Allergies  Allergen Reactions   Azithromycin Anaphylaxis   Nickel     Breaks out, red rash    Review of Systems  Constitutional: Negative.   HENT: Negative.   Eyes: Negative.   Respiratory: Negative.   Cardiovascular: Negative.   Gastrointestinal: Negative.   Genitourinary: Positive for flank pain and frequency.  Skin: Negative.   Neurological: Negative.   Psychiatric/Behavioral: Negative.        Objective:    Physical Exam Vitals and nursing note reviewed.  Constitutional:      Appearance: Normal appearance.  Cardiovascular:     Rate and Rhythm: Normal rate and regular rhythm.  Pulmonary:     Effort: Pulmonary effort is normal. No respiratory distress.  Neurological:     General: No focal deficit present.     Mental Status: She is alert and oriented to person, place, and time. Mental status is at baseline.  Psychiatric:        Mood and Affect: Mood normal.        Behavior: Behavior normal.        Thought Content: Thought content normal.        Judgment: Judgment normal.     BP 127/74 (BP Location: Right Arm, Patient Position: Sitting, Cuff Size: Small)    Pulse 83    Temp 98.2 F (36.8 C) (Temporal)    Ht 5\' 8"  (1.727 m)    Wt 120 lb 3.2 oz (54.5 kg)    LMP 07/29/2020    SpO2 100%    BMI 18.28 kg/m  Wt Readings from Last 3 Encounters:  08/10/20 120 lb 3.2 oz (54.5 kg)  02/24/19 122 lb 6.4 oz (55.5 kg)  01/23/19 120 lb 9.6 oz (54.7 kg)    Health Maintenance Due  Topic Date Due   Hepatitis C Screening  Never done   COVID-19  Vaccine (1)  Never done   HIV Screening  Never done   TETANUS/TDAP  Never done   PAP SMEAR-Modifier  02/01/2020   INFLUENZA VACCINE  07/31/2020    There are no preventive care reminders to display for this patient.   Lab Results  Component Value Date   TSH 0.79 10/29/2016   Lab Results  Component Value Date   WBC 5.7 10/29/2016   HGB 13.4 10/29/2016   HCT 43.1 10/29/2016   MCV 91.5 10/29/2016   PLT 188 10/29/2016   Lab Results  Component Value Date   NA 139 10/29/2016   K 4.1 10/29/2016   CO2 26 10/29/2016   GLUCOSE 79 10/29/2016   BUN 8 10/29/2016   CREATININE 0.68 10/29/2016   BILITOT 0.7 10/29/2016   ALKPHOS 50 10/29/2016   AST 25 10/29/2016   ALT 18 10/29/2016   PROT 7.7 10/29/2016   ALBUMIN 4.8 10/29/2016   CALCIUM 9.7 10/29/2016   ANIONGAP 7 03/02/2016   Lab Results  Component Value Date   CHOL 99 (L) 10/29/2016   Lab Results  Component Value Date   HDL 50 10/29/2016   Lab Results  Component Value Date   LDLCALC 37 10/29/2016   Lab Results  Component Value Date   TRIG 59 10/29/2016   Lab Results  Component Value Date   CHOLHDL 2.0 10/29/2016   No results found for: HGBA1C     Assessment & Plan:   Problem List Items Addressed This Visit    None    Visit Diagnoses    Dysuria    -  Primary   Relevant Medications   phenazopyridine (PYRIDIUM) 95 MG tablet   Other Relevant Orders   POCT urinalysis dipstick (Completed)   Urine Culture (Completed)   Acute cystitis without hematuria       Relevant Medications   sulfamethoxazole-trimethoprim (BACTRIM DS) 800-160 MG tablet       Meds ordered this encounter  Medications   sulfamethoxazole-trimethoprim (BACTRIM DS) 800-160 MG tablet    Sig: Take 1 tablet by mouth 2 (two) times daily.    Dispense:  6 tablet    Refill:  0    Order Specific Question:   Supervising Provider    Answer:   Neva Seat, JEFFREY R [[id number]]   phenazopyridine (PYRIDIUM) 95 MG tablet    Sig: Take 1 tablet (95 mg  total) by mouth 3 (three) times daily as needed for pain.    Dispense:  10 tablet    Refill:  0    Order Specific Question:   Supervising Provider    Answer:   Neva Seat, JEFFREY R [[id number]]   PLAN  poct ua suggestive of UTI with nitrites and leukocytes  Will send culture  Bactrim po bid for three days  Azo for symptom relief  Discussed signs of concern and reasons to return to clinic  Patient encouraged to call clinic with any questions, comments, or concerns.  Janeece Agee, NP

## 2020-10-04 ENCOUNTER — Ambulatory Visit: Payer: Self-pay | Attending: Internal Medicine

## 2020-10-04 DIAGNOSIS — Z23 Encounter for immunization: Secondary | ICD-10-CM

## 2020-10-04 NOTE — Progress Notes (Signed)
   Covid-19 Vaccination Clinic  Name:  Suzanne Wade    MRN: [id number] DOB: 29-Nov-1981  10/04/2020  [name] was observed post Covid-19 immunization for 30 minutes based on pre-vaccination screening without incident. She was provided with Vaccine Information Sheet and instruction to access the V-Safe system.   [name] was instructed to call 911 with any severe reactions post vaccine: Marland Kitchen Difficulty breathing  . Swelling of face and throat  . A fast heartbeat  . A bad rash all over body  . Dizziness and weakness   Immunizations Administered    Name Date Dose VIS Date Route   Pfizer COVID-19 Vaccine 10/04/2020  3:49 PM 0.3 mL 02/24/2019 Intramuscular   Manufacturer: [employer], [employer]   Lot: [id number]   NDC: [address]-0

## 2021-01-04 ENCOUNTER — Other Ambulatory Visit: Payer: Self-pay

## 2021-01-04 ENCOUNTER — Encounter: Payer: Self-pay | Admitting: Family Medicine

## 2021-01-04 ENCOUNTER — Telehealth (INDEPENDENT_AMBULATORY_CARE_PROVIDER_SITE_OTHER): Payer: No Typology Code available for payment source | Admitting: Family Medicine

## 2021-01-04 DIAGNOSIS — J329 Chronic sinusitis, unspecified: Secondary | ICD-10-CM

## 2021-01-04 DIAGNOSIS — J029 Acute pharyngitis, unspecified: Secondary | ICD-10-CM | POA: Diagnosis not present

## 2021-01-04 MED ORDER — IPRATROPIUM BROMIDE 0.06 % NA SOLN: 2.0000 | Freq: Four times a day (QID) | NASAL | 1 refills | Status: DC

## 2021-01-04 NOTE — Patient Instructions (Signed)
COVID-19 COVID-19 is a respiratory infection that is caused by a virus called severe acute respiratory syndrome coronavirus 2 (SARS-CoV-2). The disease is also known as coronavirus disease or novel coronavirus. In some people, the virus may not cause any symptoms. In others, it may cause a serious infection. The infection can get worse quickly and can lead to complications, such as:  Pneumonia, or infection of the lungs.  Acute respiratory distress syndrome or ARDS. This is a condition in which fluid build-up in the lungs prevents the lungs from filling with air and passing oxygen into the blood.  Acute respiratory failure. This is a condition in which there is not enough oxygen passing from the lungs to the body or when carbon dioxide is not passing from the lungs out of the body.  Sepsis or septic shock. This is a serious bodily reaction to an infection.  Blood clotting problems.  Secondary infections due to bacteria or fungus.  Organ failure. This is when your body's organs stop working. The virus that causes COVID-19 is contagious. This means that it can spread from person to person through droplets from coughs and sneezes (respiratory secretions). What are the causes? This illness is caused by a virus. You may catch the virus by:  Breathing in droplets from an infected person. Droplets can be spread by a person breathing, speaking, singing, coughing, or sneezing.  Touching something, like a table or a doorknob, that was exposed to the virus (contaminated) and then touching your mouth, nose, or eyes. What increases the risk? Risk for infection You are more likely to be infected with this virus if you:  Are within 6 feet (2 meters) of a person with COVID-19.  Provide care for or live with a person who is infected with COVID-19.  Spend time in crowded indoor spaces or live in shared housing. Risk for serious illness You are more likely to become seriously ill from the virus if you:   Are 50 years of age or older. The higher your age, the more you are at risk for serious illness.  Live in a nursing home or long-term care facility.  Have cancer.  Have a long-term (chronic) disease such as: ? Chronic lung disease, including chronic obstructive pulmonary disease or asthma. ? A long-term disease that lowers your body's ability to fight infection (immunocompromised). ? Heart disease, including heart failure, a condition in which the arteries that lead to the heart become narrow or blocked (coronary artery disease), a disease which makes the heart muscle thick, weak, or stiff (cardiomyopathy). ? Diabetes. ? Chronic kidney disease. ? Sickle cell disease, a condition in which red blood cells have an abnormal "sickle" shape. ? Liver disease.  Are obese. What are the signs or symptoms? Symptoms of this condition can range from mild to severe. Symptoms may appear any time from 2 to 14 days after being exposed to the virus. They include:  A fever or chills.  A cough.  Difficulty breathing.  Headaches, body aches, or muscle aches.  Runny or stuffy (congested) nose.  A sore throat.  New loss of taste or smell. Some people may also have stomach problems, such as nausea, vomiting, or diarrhea. Other people may not have any symptoms of COVID-19. How is this diagnosed? This condition may be diagnosed based on:  Your signs and symptoms, especially if: ? You live in an area with a COVID-19 outbreak. ? You recently traveled to or from an area where the virus is common. ? You   provide care for or live with a person who was diagnosed with COVID-19. ? You were exposed to a person who was diagnosed with COVID-19.  A physical exam.  Lab tests, which may include: ? Taking a sample of fluid from the back of your nose and throat (nasopharyngeal fluid), your nose, or your throat using a swab. ? A sample of mucus from your lungs (sputum). ? Blood tests.  Imaging tests, which  may include, X-rays, CT scan, or ultrasound. How is this treated? At present, there is no medicine to treat COVID-19. Medicines that treat other diseases are being used on a trial basis to see if they are effective against COVID-19. Your health care provider will talk with you about ways to treat your symptoms. For most people, the infection is mild and can be managed at home with rest, fluids, and over-the-counter medicines. Treatment for a serious infection usually takes places in a hospital intensive care unit (ICU). It may include one or more of the following treatments. These treatments are given until your symptoms improve.  Receiving fluids and medicines through an IV.  Supplemental oxygen. Extra oxygen is given through a tube in the nose, a face mask, or a hood.  Positioning you to lie on your stomach (prone position). This makes it easier for oxygen to get into the lungs.  Continuous positive airway pressure (CPAP) or bi-level positive airway pressure (BPAP) machine. This treatment uses mild air pressure to keep the airways open. A tube that is connected to a motor delivers oxygen to the body.  Ventilator. This treatment moves air into and out of the lungs by using a tube that is placed in your windpipe.  Tracheostomy. This is a procedure to create a hole in the neck so that a breathing tube can be inserted.  Extracorporeal membrane oxygenation (ECMO). This procedure gives the lungs a chance to recover by taking over the functions of the heart and lungs. It supplies oxygen to the body and removes carbon dioxide. Follow these instructions at home: Lifestyle  If you are sick, stay home except to get medical care. Your health care provider will tell you how long to stay home. Call your health care provider before you go for medical care.  Rest at home as told by your health care provider.  Do not use any products that contain nicotine or tobacco, such as cigarettes, e-cigarettes, and  chewing tobacco. If you need help quitting, ask your health care provider.  Return to your normal activities as told by your health care provider. Ask your health care provider what activities are safe for you. General instructions  Take over-the-counter and prescription medicines only as told by your health care provider.  Drink enough fluid to keep your urine pale yellow.  Keep all follow-up visits as told by your health care provider. This is important. How is this prevented?  There is no vaccine to help prevent COVID-19 infection. However, there are steps you can take to protect yourself and others from this virus. To protect yourself:   Do not travel to areas where COVID-19 is a risk. The areas where COVID-19 is reported change often. To identify high-risk areas and travel restrictions, check the CDC travel website: wwwnc.cdc.gov/travel/notices  If you live in, or must travel to, an area where COVID-19 is a risk, take precautions to avoid infection. ? Stay away from people who are sick. ? Wash your hands often with soap and water for 20 seconds. If soap and water   are not available, use an alcohol-based hand sanitizer. ? Avoid touching your mouth, face, eyes, or nose. ? Avoid going out in public, follow guidance from your state and local health authorities. ? If you must go out in public, wear a cloth face covering or face mask. Make sure your mask covers your nose and mouth. ? Avoid crowded indoor spaces. Stay at least 6 feet (2 meters) away from others. ? Disinfect objects and surfaces that are frequently touched every day. This may include:  Counters and tables.  Doorknobs and light switches.  Sinks and faucets.  Electronics, such as phones, remote controls, keyboards, computers, and tablets. To protect others: If you have symptoms of COVID-19, take steps to prevent the virus from spreading to others.  If you think you have a COVID-19 infection, contact your health care  provider right away. Tell your health care team that you think you may have a COVID-19 infection.  Stay home. Leave your house only to seek medical care. Do not use public transport.  Do not travel while you are sick.  Wash your hands often with soap and water for 20 seconds. If soap and water are not available, use alcohol-based hand sanitizer.  Stay away from other members of your household. Let healthy household members care for children and pets, if possible. If you have to care for children or pets, wash your hands often and wear a mask. If possible, stay in your own room, separate from others. Use a different bathroom.  Make sure that all people in your household wash their hands well and often.  Cough or sneeze into a tissue or your sleeve or elbow. Do not cough or sneeze into your hand or into the air.  Wear a cloth face covering or face mask. Make sure your mask covers your nose and mouth. Where to find more information  Centers for Disease Control and Prevention: www.cdc.gov/coronavirus/2019-ncov/index.html  World Health Organization: www.who.int/health-topics/coronavirus Contact a health care provider if:  You live in or have traveled to an area where COVID-19 is a risk and you have symptoms of the infection.  You have had contact with someone who has COVID-19 and you have symptoms of the infection. Get help right away if:  You have trouble breathing.  You have pain or pressure in your chest.  You have confusion.  You have bluish lips and fingernails.  You have difficulty waking from sleep.  You have symptoms that get worse. These symptoms may represent a serious problem that is an emergency. Do not wait to see if the symptoms will go away. Get medical help right away. Call your local emergency services (911 in the [place] Do not drive yourself to the hospital. Let the emergency medical personnel know if you think you have COVID-19. Summary  COVID-19 is a  respiratory infection that is caused by a virus. It is also known as coronavirus disease or novel coronavirus. It can cause serious infections, such as pneumonia, acute respiratory distress syndrome, acute respiratory failure, or sepsis.  The virus that causes COVID-19 is contagious. This means that it can spread from person to person through droplets from breathing, speaking, singing, coughing, or sneezing.  You are more likely to develop a serious illness if you are 50 years of age or older, have a weak immune system, live in a nursing home, or have chronic disease.  There is no medicine to treat COVID-19. Your health care provider will talk with you about ways to treat your symptoms.    Take steps to protect yourself and others from infection. Wash your hands often and disinfect objects and surfaces that are frequently touched every day. Stay away from people who are sick and wear a mask if you are sick. This information is not intended to replace advice given to you by your health care provider. Make sure you discuss any questions you have with your health care provider. Document Revised: 10/16/2019 Document Reviewed: 01/22/2019 Elsevier Patient Education  2020 [employer]  

## 2021-01-04 NOTE — Progress Notes (Signed)
Virtual Visit Note  I connected with patient on 01/04/21 at 0904 by telephone due to unable to work Epic video visit and verified that I am speaking with the correct person using two identifiers. Suzanne Wade is currently located at home and no family members are currently with them during visit. The provider, Azalee Course Fionn Stracke, FNP is located in their office at time of visit.  I discussed the limitations, risks, security and privacy concerns of performing an evaluation and management service by telephone and the availability of in person appointments. I also discussed with the patient that there may be a patient responsible charge related to this service. The patient expressed understanding and agreed to proceed.   I provided 20 minutes of non-face-to-face time during this encounter.  Chief Complaint  Patient presents with  . sinus and chest congestion     Since Monday- had tickle /scratchy throat - no current sinus pressure or headache - took robitussin 2 days     HPI ? Has been having cold symptoms Over the weekend (Sunday) congestion started Denies sinus pressure and drainage Started Robitussin on Monday this worked well Noticed sinus drainage and a sore throat that morning Has been doing tea and steam showers Continues taking daily vitamins This morning no sinus pressure, no sore throat Doesn't feel like her recurrent sinusitis Feels like a change that came from the weather Has 7 kids and her mom are worried Denies sick contacts   Allergies  Allergen Reactions  . Azithromycin Anaphylaxis  . Nickel     Breaks out, red rash    Prior to Admission medications   Medication Sig Start Date End Date Taking? Authorizing Provider  BIOTIN PO Take 1 tablet by mouth daily. Reported on 03/02/2016   Yes [provider]  Cyanocobalamin (B-12 PO) Take 1 tablet by mouth daily.    Yes [provider]  diclofenac (VOLTAREN) 75 MG EC tablet Take 1 tablet (75 mg  total) by mouth 2 (two) times daily. 08/01/17  Yes Sagardia, Eilleen Kempf, MD  esomeprazole (NEXIUM) 20 MG capsule Take 20 mg by mouth daily at 12 noon.   Yes [provider]  ferrous fumarate (HEMOCYTE - 106 MG FE) 325 (106 FE) MG TABS tablet Take 1 tablet by mouth.   Yes [provider]  Prenatal Vit-Fe Fumarate-FA (PRENATAL MULTIVITAMIN) TABS tablet Take 1 tablet by mouth daily at 12 noon. Reported on 03/02/2016   Yes [provider]  ipratropium (ATROVENT) 0.06 % nasal spray PLACE 2 SPRAYS INTO BOTH NOSTRILS 4 (FOUR) TIMES DAILY. Patient not taking: Reported on 01/04/2021 03/23/19   Doristine Bosworth, MD    Past Medical History:  Diagnosis Date  . Allergy   . Anemia   . Anxiety    panic- was out on FMLA  . Anxiety   . CHF (congestive heart failure) (HCC)   . Depression   . Heart murmur   . Lung collapse   . Neuromuscular disorder (HCC)   . Panic attacks   . Scoliosis   . Ulcer     Past Surgical History:  Procedure Laterality Date  . CESAREAN SECTION      Social History   Tobacco Use  . Smoking status: Current Some Day Smoker  . Smokeless tobacco: Never Used  Substance Use Topics  . Alcohol use: Yes    Alcohol/week: 0.0 standard drinks    Comment: 2 drinks    Family History  Problem Relation Age of Onset  .  Hypertension Mother   . Heart disease Sister   . Hyperlipidemia Sister   . Hypertension Sister   . Cancer Sister        brain  . Cancer Maternal Grandmother        uterine  . Heart disease Maternal Grandmother   . Hyperlipidemia Maternal Grandmother   . Hypertension Maternal Grandmother   . Stroke Maternal Grandmother   . Cancer Maternal Grandfather        lung  . Heart disease Maternal Grandfather   . Hyperlipidemia Maternal Grandfather   . Hypertension Maternal Grandfather   . Stroke Maternal Grandfather     Review of Systems  Constitutional: Negative for chills, fever and malaise/fatigue.  HENT: Positive for congestion and  sore throat. Negative for ear discharge, ear pain and sinus pain.   Eyes: Negative for blurred vision, double vision, discharge and redness.  Respiratory: Negative for cough, sputum production, shortness of breath and wheezing.   Cardiovascular: Negative for chest pain, palpitations and leg swelling.  Gastrointestinal: Negative for abdominal pain, constipation, diarrhea, heartburn, nausea and vomiting.  Musculoskeletal: Negative for back pain and myalgias.  Skin: Negative for rash.  Neurological: Negative for headaches.    Objective  Constitutional:      General: She is not in acute distress.    Appearance: Normal appearance. She is not ill-appearing.   Pulmonary:     Effort: Pulmonary effort is normal. No respiratory distress.  Neurological:     Mental Status: She is alert and oriented to person, place, and time.  Psychiatric:        Mood and Affect: Mood normal.        Behavior: Behavior normal.     ASSESSMENT and PLAN  Problem List Items Addressed This Visit      Respiratory   Recurrent sinusitis   Relevant Medications   ipratropium (ATROVENT) 0.06 % nasal spray    Other Visit Diagnoses    Sore throat    -  Primary   Relevant Orders   COVID-19, Flu A+B and RSV     Plan: RTC/ED precautions provided R/se/b of medications discussed Continue conservative treatments Will follow up with lab results  Return if symptoms worsen or fail to improve.    The above assessment and management plan was discussed with the patient. The patient verbalized understanding of and has agreed to the management plan. Patient is aware to call the clinic if symptoms persist or worsen. Patient is aware when to return to the clinic for a follow-up visit. Patient educated on when it is appropriate to go to the emergency department.     Macario Carls Brevan Luberto, FNP-BC [facility] at [facility] [address] [place], [place] [address] Ph.  [phone] Fax [phone]

## 2021-01-06 LAB — COVID-19, FLU A+B AND RSV
Influenza A, NAA: NOT DETECTED
Influenza B, NAA: NOT DETECTED
RSV, NAA: NOT DETECTED
SARS-CoV-2, NAA: NOT DETECTED

## 2021-03-15 ENCOUNTER — Encounter: Payer: No Typology Code available for payment source | Admitting: Registered Nurse

## 2021-08-22 ENCOUNTER — Other Ambulatory Visit: Payer: Self-pay | Admitting: Obstetrics and Gynecology

## 2021-08-22 DIAGNOSIS — R1084 Generalized abdominal pain: Secondary | ICD-10-CM

## 2021-08-31 ENCOUNTER — Other Ambulatory Visit: Payer: Self-pay

## 2021-08-31 ENCOUNTER — Ambulatory Visit
Admission: RE | Admit: 2021-08-31 | Discharge: 2021-08-31 | Disposition: A | Discharge status: Home / Self Care | Payer: No Typology Code available for payment source | Source: Ambulatory Visit | Attending: Obstetrics and Gynecology | Admitting: Obstetrics and Gynecology

## 2021-08-31 DIAGNOSIS — R1084 Generalized abdominal pain: Secondary | ICD-10-CM

## 2021-12-11 ENCOUNTER — Other Ambulatory Visit: Payer: Self-pay

## 2021-12-11 ENCOUNTER — Encounter (HOSPITAL_COMMUNITY): Payer: Self-pay

## 2021-12-11 ENCOUNTER — Ambulatory Visit (HOSPITAL_COMMUNITY)
Admission: EM | Admit: 2021-12-11 | Discharge: 2021-12-11 | Disposition: A | Discharge status: Home / Self Care | Payer: No Typology Code available for payment source | Attending: Physician Assistant | Admitting: Physician Assistant

## 2021-12-11 DIAGNOSIS — Z8679 Personal history of other diseases of the circulatory system: Secondary | ICD-10-CM | POA: Diagnosis not present

## 2021-12-11 DIAGNOSIS — Z3202 Encounter for pregnancy test, result negative: Secondary | ICD-10-CM

## 2021-12-11 DIAGNOSIS — U071 COVID-19: Secondary | ICD-10-CM | POA: Diagnosis not present

## 2021-12-11 DIAGNOSIS — R0602 Shortness of breath: Secondary | ICD-10-CM

## 2021-12-11 LAB — POC URINE PREG, ED: Preg Test, Ur: NEGATIVE

## 2021-12-11 MED ORDER — ALBUTEROL SULFATE HFA 108 (90 BASE) MCG/ACT IN AERS
2.0000 | INHALATION_SPRAY | Freq: Once | RESPIRATORY_TRACT | Status: AC
  Administered 2021-12-11: 2 via RESPIRATORY_TRACT

## 2021-12-11 MED ORDER — ALBUTEROL SULFATE HFA 108 (90 BASE) MCG/ACT IN AERS
INHALATION_SPRAY | RESPIRATORY_TRACT | Status: AC
  Filled 2021-12-11: qty 6.7

## 2021-12-11 MED ORDER — MOLNUPIRAVIR EUA 200MG CAPSULE: 4.0000 | ORAL_CAPSULE | Freq: Two times a day (BID) | ORAL | 0 refills | Status: AC

## 2021-12-11 NOTE — ED Provider Notes (Signed)
[facility]    CSN: [id number] Arrival date & time: 12/11/21  1434      History   Chief Complaint Chief Complaint  Patient presents with   Fever   Cough    Pt tested positive for Covid today.    HPI Suzanne Wade is a 40 y.o. female.   Patient presents today with a 2 to 3-day history of URI symptoms including cough, nasal congestion, shortness of breath, body aches, fatigue, malaise.  Denies any chest pain but does report some tightness and discomfort particularly with coughing.  Suzanne Wade is confident that Suzanne Wade is not pregnant as Suzanne Wade has an IUD.  Suzanne Wade has tried Excedrin Migraine and additional over-the-counter medication without improvement of symptoms.  Suzanne Wade has had COVID-19 vaccine but has not had booster.  Suzanne Wade reports taking at home COVID-19 test (2 of them) that was positive earlier today.  Suzanne Wade does have a history of heart disease and has had spontaneous pneumothorax in the past but denies history of asthma or COPD.  Suzanne Wade is a former smoker.  Denies history of diabetes, immunosuppression, malignancy.  Denies any recent antibiotic use.  Suzanne Wade is having difficulty with daily duties as result of symptoms.   Past Medical History:  Diagnosis Date   Allergy    Anemia    Anxiety    panic- was out on FMLA   Anxiety    CHF (congestive heart failure) (HCC)    Depression    Heart murmur    Lung collapse    Neuromuscular disorder (HCC)    Panic attacks    Scoliosis    Ulcer     Patient Active Problem List   Diagnosis Date Noted   Recurrent sinusitis 01/23/2019   Acute pain of left shoulder 08/01/2017   Sprain of shoulder, left 08/01/2017   Syncope and collapse 02/22/2014   Anxiety state, unspecified 02/22/2014    Past Surgical History:  Procedure Laterality Date   CESAREAN SECTION      OB History     Gravida  6   Para  3   Term      Preterm      AB      Living         SAB      IAB      Ectopic      Multiple      Live Births                Home Medications    Prior to Admission medications   Medication Sig Start Date End Date Taking? Authorizing Provider  molnupiravir EUA (LAGEVRIO) 200 mg CAPS capsule Take 4 capsules (800 mg total) by mouth 2 (two) times daily for 5 days. 12/11/21 12/16/21 Yes Tranisha Tissue K, PA-C  BIOTIN PO Take 1 tablet by mouth daily. Reported on 03/02/2016    [provider]  Cyanocobalamin (B-12 PO) Take 1 tablet by mouth daily.     [provider]  diclofenac (VOLTAREN) 75 MG EC tablet Take 1 tablet (75 mg total) by mouth 2 (two) times daily. 08/01/17   Georgina Quint, MD  esomeprazole (NEXIUM) 20 MG capsule Take 20 mg by mouth daily at 12 noon.    [provider]  ferrous fumarate (HEMOCYTE - 106 MG FE) 325 (106 FE) MG TABS tablet Take 1 tablet by mouth.    [provider]  ipratropium (ATROVENT) 0.06 % nasal spray Place 2 sprays into both nostrils 4 (four) times  daily. 01/04/21   Just, Azalee Course, FNP  Prenatal Vit-Fe Fumarate-FA (PRENATAL MULTIVITAMIN) TABS tablet Take 1 tablet by mouth daily at 12 noon. Reported on 03/02/2016    [provider]    Family History Family History  Problem Relation Age of Onset   Hypertension Mother    Heart disease Sister    Hyperlipidemia Sister    Hypertension Sister    Cancer Sister        brain   Cancer Maternal Grandmother        uterine   Heart disease Maternal Grandmother    Hyperlipidemia Maternal Grandmother    Hypertension Maternal Grandmother    Stroke Maternal Grandmother    Cancer Maternal Grandfather        lung   Heart disease Maternal Grandfather    Hyperlipidemia Maternal Grandfather    Hypertension Maternal Grandfather    Stroke Maternal Grandfather     Social History Social History   Tobacco Use   Smoking status: Some Days   Smokeless tobacco: Never  Vaping Use   Vaping Use: Never used  Substance Use Topics   Alcohol use: Yes    Alcohol/week: 0.0 standard drinks    Comment: 2  drinks   Drug use: No     Allergies   Azithromycin and Nickel   Review of Systems Review of Systems  Constitutional:  Positive for activity change, fatigue and fever. Negative for appetite change.  HENT:  Positive for congestion. Negative for sinus pressure, sneezing and sore throat.   Respiratory:  Positive for cough, chest tightness and shortness of breath. Negative for wheezing.   Cardiovascular:  Negative for chest pain.  Gastrointestinal:  Negative for abdominal pain, diarrhea, nausea and vomiting.  Musculoskeletal:  Positive for arthralgias and myalgias.  Neurological:  Positive for headaches. Negative for dizziness and light-headedness.    Physical Exam Triage Vital Signs ED Triage Vitals  Enc Vitals Group     BP 12/11/21 1614 124/83     Pulse --      Resp 12/11/21 1614 16     Temp 12/11/21 1612 98.9 F (37.2 C)     Temp Source 12/11/21 1612 Oral     SpO2 12/11/21 1614 100 %     Weight --      Height --      Head Circumference --      Peak Flow --      Pain Score --      Pain Loc --      Pain Edu? --      Excl. in GC? --    No data found.  Updated Vital Signs BP 124/83 (BP Location: Left Arm)   Temp 98.9 F (37.2 C) (Oral)   Resp 16   LMP 11/29/2021 (Approximate)   SpO2 100%   Visual Acuity Right Eye Distance:   Left Eye Distance:   Bilateral Distance:    Right Eye Near:   Left Eye Near:    Bilateral Near:     Physical Exam Vitals reviewed.  Constitutional:      General: Suzanne Wade is awake. Suzanne Wade is not in acute distress.    Appearance: Normal appearance. Suzanne Wade is well-developed. Suzanne Wade is ill-appearing.     Comments: Very pleasant female appears stated age in no acute distress sitting comfortably in exam room  HENT:     Head: Normocephalic and atraumatic.     Right Ear: Tympanic membrane, ear canal and external ear normal. Tympanic membrane is not erythematous or  bulging.     Left Ear: Tympanic membrane, ear canal and external ear normal. Tympanic  membrane is not erythematous or bulging.     Nose:     Right Sinus: No maxillary sinus tenderness or frontal sinus tenderness.     Left Sinus: No maxillary sinus tenderness or frontal sinus tenderness.     Mouth/Throat:     Pharynx: Uvula midline. Posterior oropharyngeal erythema present. No oropharyngeal exudate.     Comments: Erythema and drainage in posterior oropharynx Cardiovascular:     Rate and Rhythm: Normal rate and regular rhythm.     Heart sounds: Normal heart sounds, S1 normal and S2 normal. No murmur heard. Pulmonary:     Effort: Pulmonary effort is normal.     Breath sounds: Normal breath sounds. No wheezing, rhonchi or rales.     Comments: Clear to auscultation.  Reactive cough with deep breathing. Psychiatric:        Behavior: Behavior is cooperative.     UC Treatments / Results  Labs (all labs ordered are listed, but only abnormal results are displayed) Labs Reviewed  POC URINE PREG, ED    EKG   Radiology No results found.  Procedures Procedures (including critical care time)  Medications Ordered in UC Medications  albuterol (VENTOLIN HFA) 108 (90 Base) MCG/ACT inhaler 2 puff (2 puffs Inhalation Given 12/11/21 1652)    Initial Impression / Assessment and Plan / UC Course  I have reviewed the triage vital signs and the nursing notes.  Pertinent labs & imaging results that were available during my care of the patient were reviewed by me and considered in my medical decision making (see chart for details).     Vital signs and physical exam reassuring today; no indication for emergent evaluation or imaging.  Patient reported positive at-home COVID test so additional testing was not performed.  Suzanne Wade is within 5 days of symptom onset and has risk factors including history of heart disease so Suzanne Wade was started on antiviral medication.  I discussed the risks and benefits of outpatient antiviral medication and since Paxlovid would require blood work to monitor  kidney function since last metabolic panel in epic is from 2017.  Patient preferred not to have blood work done so we will start molnupiravir.  Patient had negative pregnancy test in clinic and has an IUD.  We discussed that Suzanne Wade should avoid pregnancy for minimum of 3 months after use of this medication.  Suzanne Wade was given albuterol in clinic with improvement of chest tightness and shortness of breath and sent home with this medication with instruction to use it every 4-6 hours as needed.  Suzanne Wade can use over-the-counter medication for symptom relief.  Discussed alarm symptoms that warrant emergent evaluation.  Strict return precautions given to which Suzanne Wade expressed understanding.  Suzanne Wade was provided work excuse note with current CDC return to work guidelines.  Final Clinical Impressions(s) / UC Diagnoses   Final diagnoses:  COVID-19  Shortness of breath  History of heart disease     Discharge Instructions      Start antiviral medication as prescribed twice daily for 5 days.  As we discussed you should avoid getting pregnant for 3 months after use of this medication.  Use your albuterol inhaler every 4-6 hours as needed.  Use over-the-counter medication for additional symptom relief such as Mucinex and Tylenol.  Make sure you rest and drink plenty of fluid.  If you have any sudden worsening of symptoms you need to return for  reevaluation.  You can return to work after you have had 5 days of symptoms and symptoms are improving without fever.  If you return before 10 days from symptom onset you need to wear an N95 mask until you reach day 10 from symptom onset.     ED Prescriptions     Medication Sig Dispense Auth. Provider   molnupiravir EUA (LAGEVRIO) 200 mg CAPS capsule Take 4 capsules (800 mg total) by mouth 2 (two) times daily for 5 days. 40 capsule Tiny Rietz K, PA-C      PDMP not reviewed this encounter.   Jeani Hawking, PA-C 12/11/21 1720

## 2021-12-11 NOTE — Discharge Instructions (Signed)
Start antiviral medication as prescribed twice daily for 5 days.  As we discussed you should avoid getting pregnant for 3 months after use of this medication.  Use your albuterol inhaler every 4-6 hours as needed.  Use over-the-counter medication for additional symptom relief such as Mucinex and Tylenol.  Make sure you rest and drink plenty of fluid.  If you have any sudden worsening of symptoms you need to return for reevaluation.  You can return to work after you have had 5 days of symptoms and symptoms are improving without fever.  If you return before 10 days from symptom onset you need to wear an N95 mask until you reach day 10 from symptom onset.

## 2021-12-11 NOTE — ED Triage Notes (Signed)
Pt presents to the office for cough,sob,body aches x 2-3 days. Tested positive for Covid.

## 2022-12-10 IMAGING — US US ABDOMEN COMPLETE
1 series · 14 of 25 positions shown · non-contrast
Comparison: None.

CLINICAL DATA: General abdominal pain

EXAM:
ABDOMEN ULTRASOUND COMPLETE

[Series 1: us abdomen complete · 0.12mm/px · 14 of 75 slices shown]
[im 1/75]
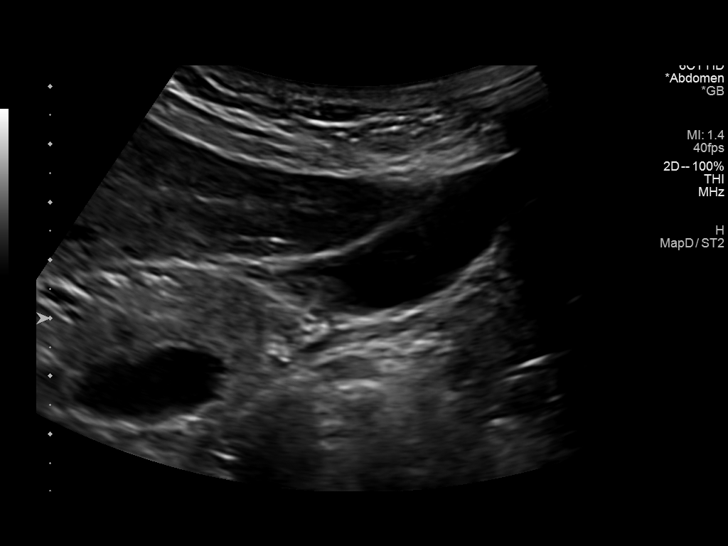
[im 7/75]
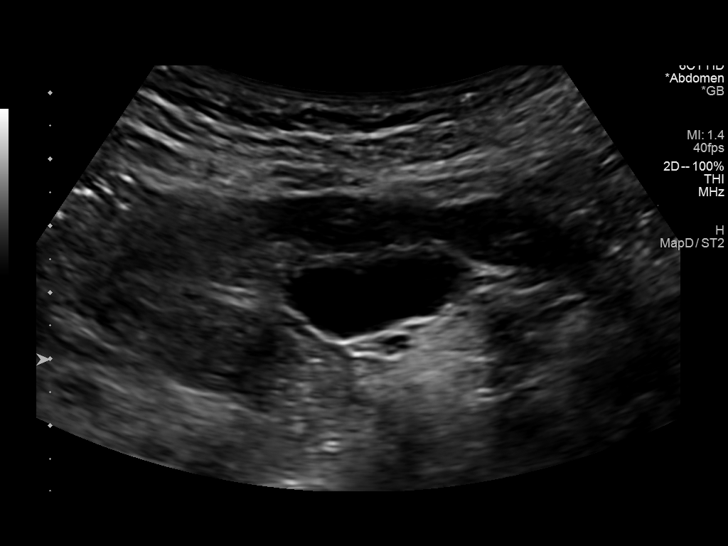
[im 13/75]
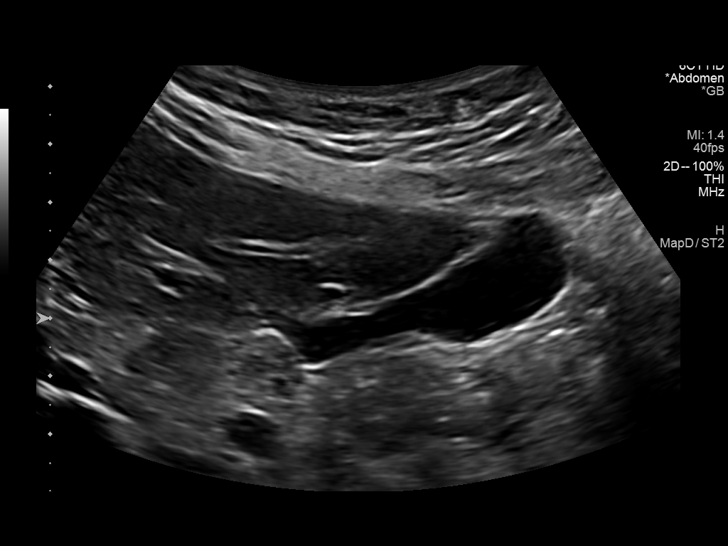
[im 19/75]
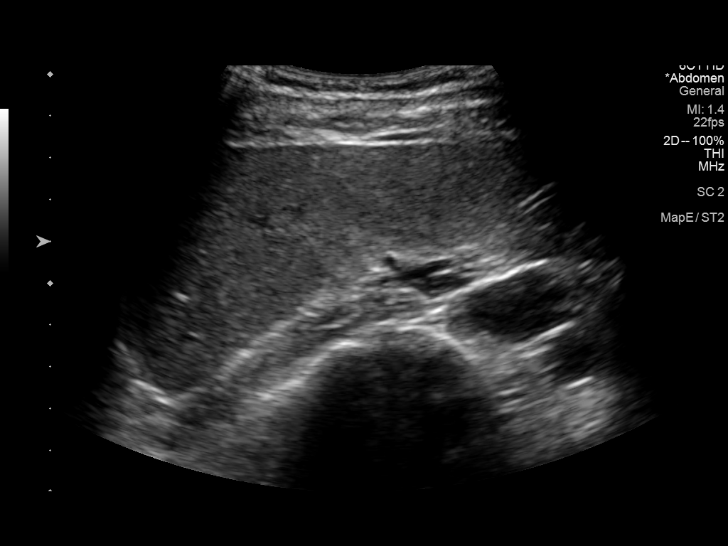
[im 25/75]
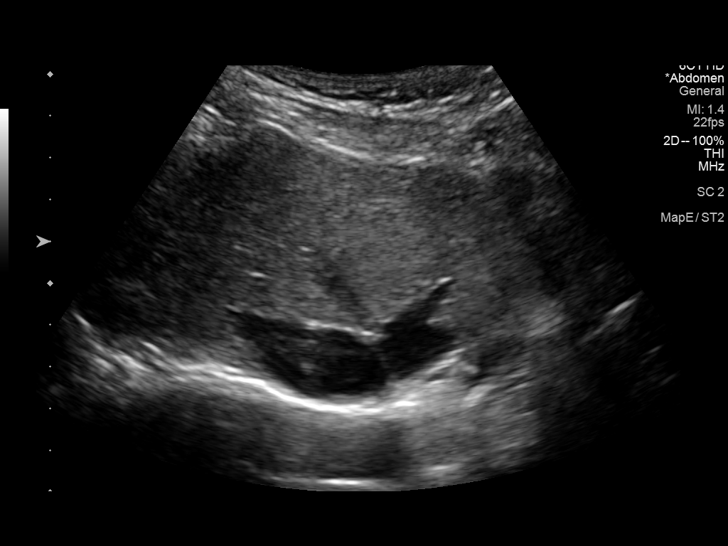
[im 28/75]
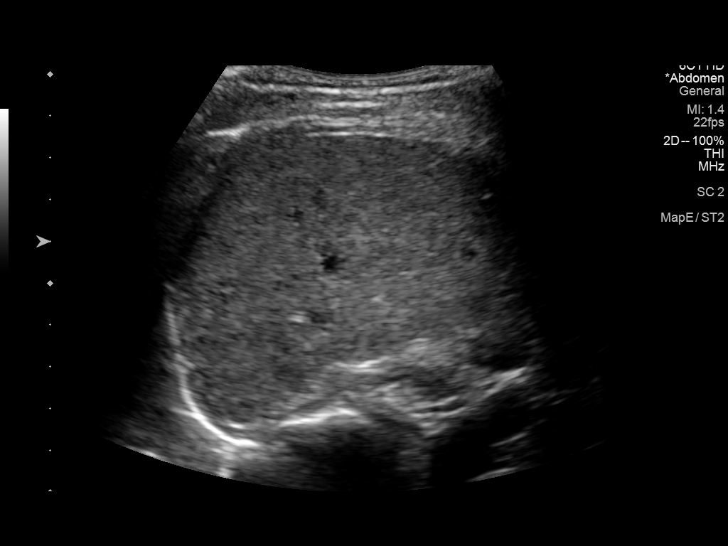
[im 34/75]
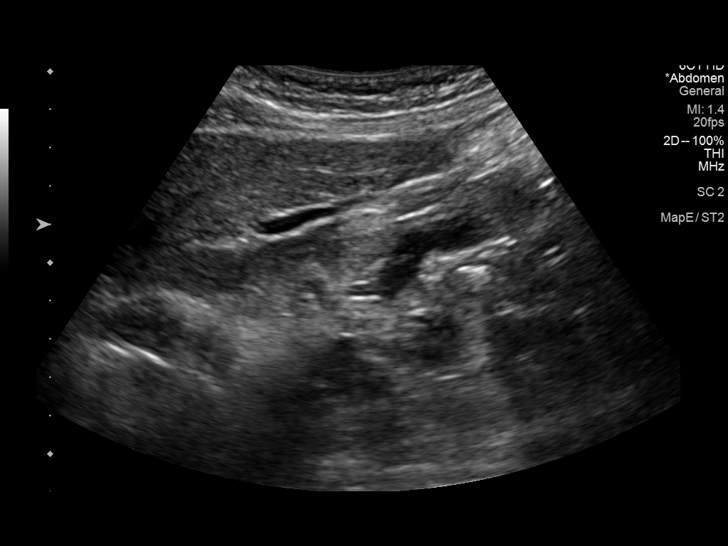
[im 41/75]
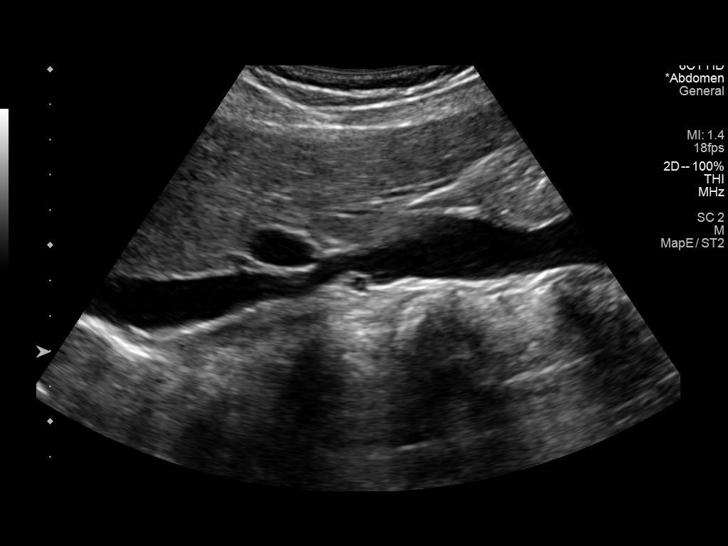
[im 47/75]
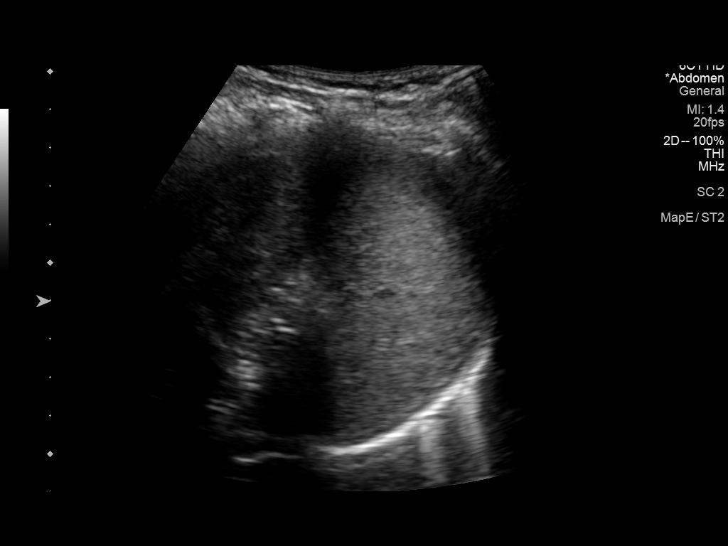
[im 50/75]
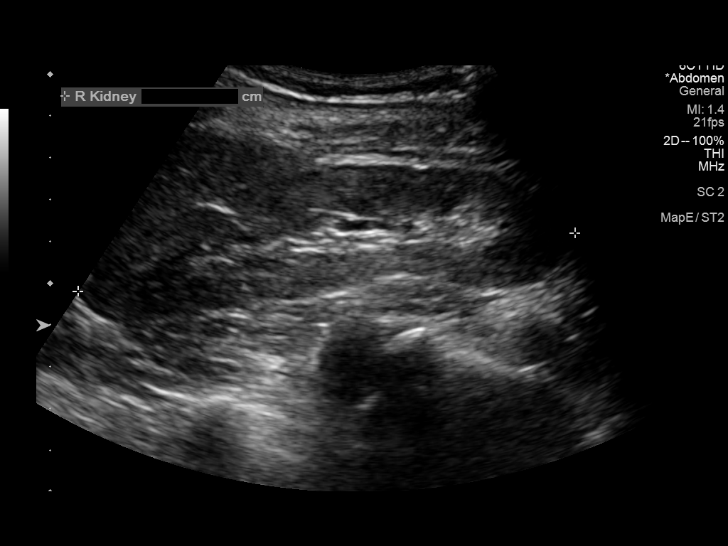
[im 56/75]
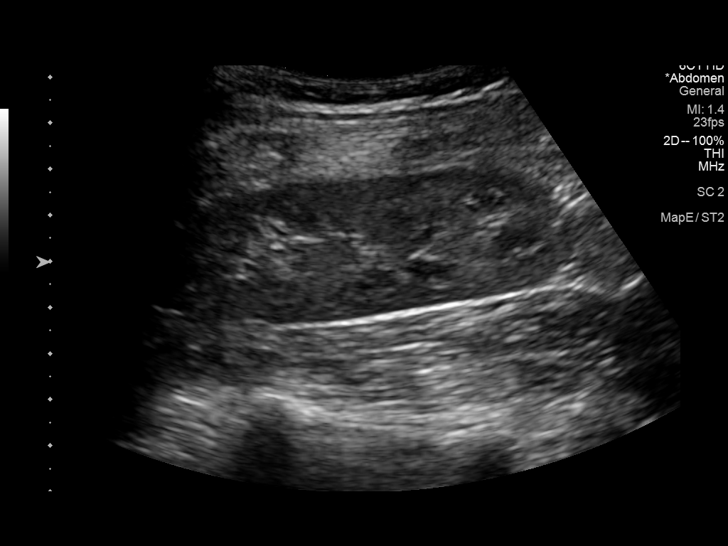
[im 62/75]
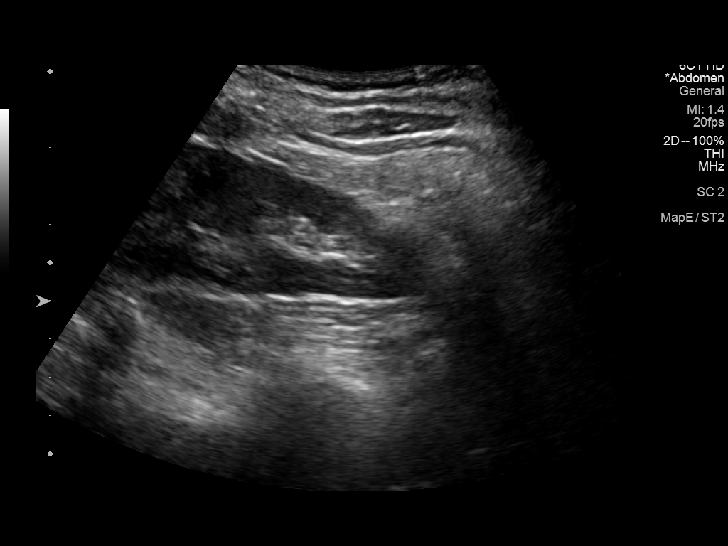
[im 68/75]
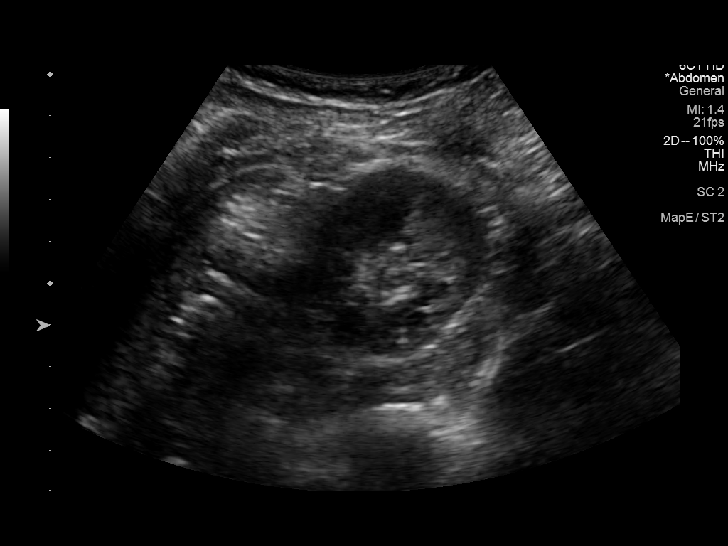
[im 75/75]
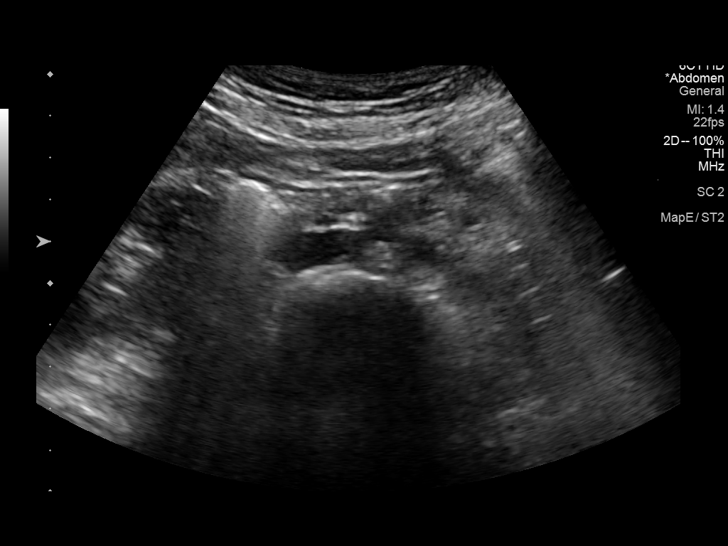

[14 of 25 positions shown; findings below may reference images not displayed]

FINDINGS: Gallbladder: No gallstones or wall thickening visualized. No
sonographic Murphy sign noted by sonographer.

Common bile duct: Diameter: 0.3 cm, within normal limits

Liver: No focal lesion identified. Within normal limits in
parenchymal echogenicity. Portal vein is patent on color Doppler
imaging with normal direction of blood flow towards the liver.

IVC: No abnormality visualized.

Pancreas: Visualized portion unremarkable.

Spleen: Size and appearance within normal limits.

Right Kidney: Length: 12.0 cm. Echogenicity within normal limits. No
mass or hydronephrosis visualized.

Left Kidney: Length: 11.0 cm. Echogenicity within normal limits. No
mass or hydronephrosis visualized.

Abdominal aorta: No aneurysm visualized.

Other findings: None.
IMPRESSION: No sonographic finding to explain the patient's abdominal pain.

## 2022-12-14 ENCOUNTER — Encounter (HOSPITAL_COMMUNITY): Payer: Self-pay

## 2022-12-14 ENCOUNTER — Ambulatory Visit (HOSPITAL_COMMUNITY)
Admission: EM | Admit: 2022-12-14 | Discharge: 2022-12-14 | Disposition: A | Discharge status: Home / Self Care | Payer: No Typology Code available for payment source | Attending: Emergency Medicine | Admitting: Emergency Medicine

## 2022-12-14 ENCOUNTER — Ambulatory Visit (INDEPENDENT_AMBULATORY_CARE_PROVIDER_SITE_OTHER): Payer: No Typology Code available for payment source

## 2022-12-14 DIAGNOSIS — Z1152 Encounter for screening for COVID-19: Secondary | ICD-10-CM | POA: Insufficient documentation

## 2022-12-14 DIAGNOSIS — R0789 Other chest pain: Secondary | ICD-10-CM | POA: Diagnosis not present

## 2022-12-14 DIAGNOSIS — J069 Acute upper respiratory infection, unspecified: Secondary | ICD-10-CM | POA: Insufficient documentation

## 2022-12-14 DIAGNOSIS — R071 Chest pain on breathing: Secondary | ICD-10-CM | POA: Diagnosis not present

## 2022-12-14 LAB — POCT URINALYSIS DIPSTICK, ED / UC
Glucose, UA: NEGATIVE mg/dL
Ketones, ur: 15 mg/dL — AB
Leukocytes,Ua: NEGATIVE
Nitrite: NEGATIVE
Protein, ur: NEGATIVE mg/dL
Specific Gravity, Urine: >=1.03 (ref 1.005–1.030)
Urobilinogen, UA: 4 mg/dL — ABNORMAL HIGH (ref 0.0–1.0)
pH: 5.5 (ref 5.0–8.0)

## 2022-12-14 LAB — RESP PANEL BY RT-PCR (FLU A&B, COVID) ARPGX2
Influenza A by PCR: NEGATIVE
Influenza B by PCR: NEGATIVE
SARS Coronavirus 2 by RT PCR: NEGATIVE

## 2022-12-14 MED ORDER — FLUTICASONE PROPIONATE 50 MCG/ACT NA SUSP: 1.0000 | Freq: Every day | NASAL | 2 refills | Status: DC

## 2022-12-14 NOTE — ED Provider Notes (Signed)
[facility]    CSN: [id number] Arrival date & time: 12/14/22  1321      History   Chief Complaint Chief Complaint  Patient presents with   Nasal Congestion   Chest Pain    RIB    HPI Suzanne Wade is a 41 y.o. female.  Presents with 5 day history of congestion, runny nose, and right side rib pain Reports pain is worse when she takes a deep breath Tried ibuprofen that may have helped. Denies shortness of breath or trouble breathing. She has history of collapsed lung.  Has tried drinking tea for her symptoms  Additionally reports some pressure in the suprapubic area with urinating. Some frequency. No dysuria or hematuria.  No fevers or flank pain No n/v/d  Past Medical History:  Diagnosis Date   Allergy    Anemia    Anxiety    panic- was out on FMLA   Anxiety    CHF (congestive heart failure) (HCC)    Depression    Heart murmur    Lung collapse    Neuromuscular disorder (HCC)    Panic attacks    Scoliosis    Ulcer     Patient Active Problem List   Diagnosis Date Noted   Recurrent sinusitis 01/23/2019   Acute pain of left shoulder 08/01/2017   Sprain of shoulder, left 08/01/2017   Syncope and collapse 02/22/2014   Anxiety state, unspecified 02/22/2014    Past Surgical History:  Procedure Laterality Date   CESAREAN SECTION      OB History     Gravida  6   Para  3   Term      Preterm      AB      Living         SAB      IAB      Ectopic      Multiple      Live Births               Home Medications    Prior to Admission medications   Medication Sig Start Date End Date Taking? Authorizing Provider  fluticasone (FLONASE) 50 MCG/ACT nasal spray Place 1 spray into both nostrils daily. 12/14/22  Yes Kathreen Dileo, Lurena Joiner, PA-C  BIOTIN PO Take 1 tablet by mouth daily. Reported on 03/02/2016 Patient not taking: Reported on 12/14/2022    [provider]  Cyanocobalamin (B-12 PO) Take 1 tablet by mouth daily.   Patient not taking: Reported on 12/14/2022    [provider]  diclofenac (VOLTAREN) 75 MG EC tablet Take 1 tablet (75 mg total) by mouth 2 (two) times daily. Patient not taking: Reported on 12/14/2022 08/01/17   Georgina Quint, MD  esomeprazole (NEXIUM) 20 MG capsule Take 20 mg by mouth daily at 12 noon. Patient not taking: Reported on 12/14/2022    [provider]  ferrous fumarate (HEMOCYTE - 106 MG FE) 325 (106 FE) MG TABS tablet Take 1 tablet by mouth. Patient not taking: Reported on 12/14/2022    [provider]  ipratropium (ATROVENT) 0.06 % nasal spray Place 2 sprays into both nostrils 4 (four) times daily. Patient not taking: Reported on 12/14/2022 01/04/21   Just, Azalee Course, FNP  Prenatal Vit-Fe Fumarate-FA (PRENATAL MULTIVITAMIN) TABS tablet Take 1 tablet by mouth daily at 12 noon. Reported on 03/02/2016 Patient not taking: Reported on 12/14/2022    [provider]    Family History Family History  Problem Relation Age  of Onset   Hypertension Mother    Heart disease Sister    Hyperlipidemia Sister    Hypertension Sister    Cancer Sister        brain   Cancer Maternal Grandmother        uterine   Heart disease Maternal Grandmother    Hyperlipidemia Maternal Grandmother    Hypertension Maternal Grandmother    Stroke Maternal Grandmother    Cancer Maternal Grandfather        lung   Heart disease Maternal Grandfather    Hyperlipidemia Maternal Grandfather    Hypertension Maternal Grandfather    Stroke Maternal Grandfather     Social History Social History   Tobacco Use   Smoking status: Some Days   Smokeless tobacco: Never  Vaping Use   Vaping Use: Never used  Substance Use Topics   Alcohol use: Yes    Alcohol/week: 0.0 standard drinks of alcohol    Comment: 2 drinks   Drug use: No     Allergies   Azithromycin and Nickel   Review of Systems Review of Systems Per HPI  Physical Exam Triage Vital Signs ED Triage  Vitals  Enc Vitals Group     BP 12/14/22 1503 (!) 136/100     Pulse Rate 12/14/22 1503 74     Resp 12/14/22 1503 14     Temp 12/14/22 1503 98.4 F (36.9 C)     Temp Source 12/14/22 1503 Oral     SpO2 12/14/22 1503 100 %     Weight --      Height --      Head Circumference --      Peak Flow --      Pain Score 12/14/22 1504 4     Pain Loc --      Pain Edu? --      Excl. in GC? --    No data found.  Updated Vital Signs BP (!) 136/100 (BP Location: Left Arm)   Pulse 74   Temp 98.4 F (36.9 C) (Oral)   Resp 14   SpO2 100%    Physical Exam Vitals and nursing note reviewed.  Constitutional:      General: She is not in acute distress.    Appearance: She is not ill-appearing.  HENT:     Nose: Congestion present. No rhinorrhea.     Mouth/Throat:     Mouth: Mucous membranes are moist.     Pharynx: Oropharynx is clear. No posterior oropharyngeal erythema.  Eyes:     Conjunctiva/sclera: Conjunctivae normal.  Cardiovascular:     Rate and Rhythm: Normal rate and regular rhythm.     Pulses: Normal pulses.     Heart sounds: Normal heart sounds.  Pulmonary:     Effort: Pulmonary effort is normal.     Breath sounds: Normal breath sounds.     Comments: Lungs are clear throughout. No decreased air movement Abdominal:     Tenderness: There is no abdominal tenderness. There is no guarding.  Musculoskeletal:     Comments: Right ribs a little tender to palpation  Lymphadenopathy:     Cervical: No cervical adenopathy.  Skin:    General: Skin is warm and dry.  Neurological:     Mental Status: She is alert and oriented to person, place, and time.     UC Treatments / Results  Labs (all labs ordered are listed, but only abnormal results are displayed) Labs Reviewed  POCT URINALYSIS DIPSTICK, ED / UC -  Abnormal; Notable for the following components:      Result Value   Bilirubin Urine SMALL (*)    Ketones, ur 15 (*)    Hgb urine dipstick TRACE (*)    Urobilinogen, UA 4.0 (*)     All other components within normal limits  RESP PANEL BY RT-PCR (FLU A&B, COVID) [id number]    EKG   Radiology DG Chest 2 View  Result Date: 12/14/2022 CLINICAL DATA:  Right-sided pain with breathing. EXAM: CHEST - 2 VIEW COMPARISON:  12/27/2018 FINDINGS: Both lungs are clear. Negative for pneumothorax. Evidence for chronic scarring at the lung apices. Heart and mediastinum are within normal limits. Trachea is midline. No pleural effusions. Mild dextroscoliosis in the thoracolumbar spine. IMPRESSION: 1. No acute cardiopulmonary disease. 2. Mild scoliosis. Electronically Signed   By: Richarda Overlie M.D.   On: 12/14/2022 15:54    Procedures Procedures (including critical care time)  Medications Ordered in UC Medications - No data to display  Initial Impression / Assessment and Plan / UC Course  I have reviewed the triage vital signs and the nursing notes.  Pertinent labs & imaging results that were available during my care of the patient were reviewed by me and considered in my medical decision making (see chart for details).  Chest xray negative. No signs of rib abnormality, lungs are clear and intact. Suspect muscular etiology, recommend tylenol or ibuprofen for pain.  UA with trace hgb, elevated specific gravity. No obvious sign of infection. Needs to increase fluid intake.  Covid/flu pending per patient request although she is out of antiviral treatment window. Symptomatic care discussed including mucinex, allergy med, nasal spray  Return precautions discussed. Patient agrees to plan  Final Clinical Impressions(s) / UC Diagnoses   Final diagnoses:  Viral upper respiratory tract infection  Right-sided chest wall pain     Discharge Instructions      Chest xray looks good.  Mucinex twice daily for congestion. Can also add nasal spray. Tylenol or ibuprofen for pain/aches.  Urine shows dehydration. Increase your fluid intake as much as tolerated!  We will call you if your  covid or flu test returns positive.       ED Prescriptions     Medication Sig Dispense Auth. Provider   fluticasone (FLONASE) 50 MCG/ACT nasal spray Place 1 spray into both nostrils daily. 15.8 mL Maely Clements, Lurena Joiner, PA-C      PDMP not reviewed this encounter.   Kathrine Haddock 12/14/22 3664

## 2022-12-14 NOTE — ED Triage Notes (Signed)
Pt presents with c/o nasal congestion and rt rib pain x 4 days.

## 2022-12-14 NOTE — Discharge Instructions (Addendum)
Chest xray looks good.  Mucinex twice daily for congestion. Can also add nasal spray. Tylenol or ibuprofen for pain/aches.  Urine shows dehydration. Increase your fluid intake as much as tolerated!  We will call you if your covid or flu test returns positive.

## 2023-11-11 ENCOUNTER — Emergency Department (HOSPITAL_COMMUNITY)
Admission: EM | Admit: 2023-11-11 | Discharge: 2023-11-12 | Disposition: A | Discharge status: Home / Self Care | Payer: No Typology Code available for payment source | Attending: Emergency Medicine | Admitting: Emergency Medicine

## 2023-11-11 ENCOUNTER — Emergency Department (HOSPITAL_COMMUNITY): Payer: No Typology Code available for payment source

## 2023-11-11 ENCOUNTER — Encounter (HOSPITAL_COMMUNITY): Payer: Self-pay | Admitting: Emergency Medicine

## 2023-11-11 ENCOUNTER — Other Ambulatory Visit: Payer: Self-pay

## 2023-11-11 DIAGNOSIS — Z7982 Long term (current) use of aspirin: Secondary | ICD-10-CM | POA: Diagnosis not present

## 2023-11-11 DIAGNOSIS — J029 Acute pharyngitis, unspecified: Secondary | ICD-10-CM

## 2023-11-11 DIAGNOSIS — J069 Acute upper respiratory infection, unspecified: Secondary | ICD-10-CM | POA: Diagnosis not present

## 2023-11-11 DIAGNOSIS — J040 Acute laryngitis: Secondary | ICD-10-CM | POA: Insufficient documentation

## 2023-11-11 DIAGNOSIS — Z1152 Encounter for screening for COVID-19: Secondary | ICD-10-CM | POA: Insufficient documentation

## 2023-11-11 MED ORDER — OXYCODONE-ACETAMINOPHEN 5-325 MG PO TABS
1.0000 | ORAL_TABLET | Freq: Once | ORAL | Status: AC
Start: 2023-11-11 — End: 2023-11-12
  Administered 2023-11-12: 1 via ORAL
  Filled 2023-11-11: qty 1

## 2023-11-11 MED ORDER — LIDOCAINE VISCOUS HCL 2 % MT SOLN
15.0000 mL | Freq: Once | OROMUCOSAL | Status: AC
  Administered 2023-11-12: 15 mL via OROMUCOSAL
  Filled 2023-11-11: qty 15

## 2023-11-11 NOTE — ED Triage Notes (Addendum)
Pt here for throat pain, loss of voice, and cough that started on Saturday. Reports difficulty swallowing that started today. NAD in triage, having visitor speak for her. Reports CP that started on Sunday and ShOB that started today.

## 2023-11-11 NOTE — ED Provider Triage Note (Signed)
  Emergency Medicine Provider Triage Evaluation Note  MRN:  [id number]  Arrival date & time: 11/11/23    Medically screening exam initiated at 11:19 PM.   CC:   Sore Throat   HPI:  Suzanne Wade is a 42 y.o. year-old female presents to the ED with chief complaint of sore throat.  Also reports come chest pain and SOB.  Reports that it is hard to talk because of the pain.  Hx of pneumothorax.  History provided by patient ROS:  -As included in HPI PE:   Vitals:   11/11/23 2257  BP: (!) 126/90  Pulse: 72  Resp: 19  Temp: 98.5 F (36.9 C)  SpO2: 100%    Non-toxic appearing No respiratory distress No evidence of peritonsillar abscess MDM:  Based on signs and symptoms, pharyngitis is highest on my differential. I've ordered labs and imaging in triage to expedite lab/diagnostic workup.  Patient was informed that the remainder of the evaluation will be completed by another provider, this initial triage assessment does not replace that evaluation, and the importance of remaining in the ED until their evaluation is complete.    Roxy Horseman, PA-C 11/11/23 2321

## 2023-11-12 LAB — RESP PANEL BY RT-PCR (RSV, FLU A&B, COVID)  RVPGX2
Influenza A by PCR: NEGATIVE
Influenza B by PCR: NEGATIVE
Resp Syncytial Virus by PCR: NEGATIVE
SARS Coronavirus 2 by RT PCR: NEGATIVE

## 2023-11-12 LAB — GROUP A STREP BY PCR: Group A Strep by PCR: NOT DETECTED

## 2023-11-12 MED ORDER — DEXAMETHASONE 4 MG PO TABS
8.0000 mg | ORAL_TABLET | Freq: Once | ORAL | Status: AC
  Administered 2023-11-12: 8 mg via ORAL
  Filled 2023-11-12: qty 2

## 2023-11-12 MED ORDER — KETOROLAC TROMETHAMINE 60 MG/2ML IM SOLN
30.0000 mg | Freq: Once | INTRAMUSCULAR | Status: AC
  Administered 2023-11-12: 30 mg via INTRAMUSCULAR
  Filled 2023-11-12: qty 2

## 2023-11-12 NOTE — ED Notes (Signed)
Pt's husband stated, the Percocet did work but its wore off and the Lidocaine. Pt is still a 9 for her throat.

## 2023-11-12 NOTE — Discharge Instructions (Addendum)
Your symptoms are consistent with a likely viral upper respiratory infection and pharyngitis.  Recommend you continue oral rehydration at home, continue Tylenol and ibuprofen for pain control.  Return to the emergency department if you develop worsening swelling in your neck, difficulty breathing or speaking or any other concerns or complaints.

## 2023-11-12 NOTE — ED Provider Notes (Addendum)
[facility] Provider Note   CSN: [id number] Arrival date & time: 11/11/23  2234     History  Chief Complaint  Patient presents with   Sore Throat    Suzanne Wade is a 42 y.o. female.   Sore Throat     42 year old female presenting to the emergency department with a sore throat.  Patient states that she has had symptoms of an upper respiratory infection since Saturday.  She states that she has had a laryngitis with loss of voice, pain in her throat bilaterally with no significant unilateral swelling, good range of motion of the neck, feeling subjectively warm but no fevers, mild nonproductive cough. No active chest pain.  He does have a history of spontaneous pneumothorax.  Home Medications Prior to Admission medications   Medication Sig Start Date End Date Taking? Authorizing Provider  acetaminophen (TYLENOL) 500 MG tablet Take 1,000 mg by mouth as needed for moderate pain (pain score 4-6) or mild pain (pain score 1-3).   Yes [provider]  aspirin EC 81 MG tablet Take 81 mg by mouth as needed (chest pain). Swallow whole.   Yes [provider]  aspirin-acetaminophen-caffeine (EXCEDRIN MIGRAINE) 978 [id number] MG tablet Take 2 tablets by mouth as needed for headache or migraine.   Yes [provider]      Allergies    Azithromycin and Nickel    Review of Systems   Review of Systems  All other systems reviewed and are negative.   Physical Exam Updated Vital Signs BP 109/84   Pulse 74   Temp 98.2 F (36.8 C)   Resp 16   Ht 5\' 8"  (1.727 m)   Wt 54.5 kg   SpO2 100%   BMI 18.27 kg/m  Physical Exam Vitals and nursing note reviewed.  Constitutional:      General: She is not in acute distress.    Appearance: She is well-developed.  HENT:     Head: Normocephalic and atraumatic.     Right Ear: Tympanic membrane normal.     Left Ear: Tympanic membrane normal.     Mouth/Throat:      Pharynx: Posterior oropharyngeal erythema present. No oropharyngeal exudate.     Tonsils: No tonsillar exudate or tonsillar abscesses. 1+ on the right. 1+ on the left.     Comments: Mild oropharyngeal erythema without significant exudate.  Tonsils with 1+ swelling bilaterally, no unilateral evidence of PTA.  Range of motion of the neck intact actively without difficulty. Eyes:     Conjunctiva/sclera: Conjunctivae normal.  Cardiovascular:     Rate and Rhythm: Normal rate and regular rhythm.     Heart sounds: No murmur heard. Pulmonary:     Effort: Pulmonary effort is normal. No tachypnea or respiratory distress.     Breath sounds: Normal breath sounds and air entry.  Abdominal:     Palpations: Abdomen is soft.     Tenderness: There is no abdominal tenderness.  Musculoskeletal:        General: No swelling.     Cervical back: Neck supple.  Skin:    General: Skin is warm and dry.     Capillary Refill: Capillary refill takes less than 2 seconds.  Neurological:     Mental Status: She is alert.  Psychiatric:        Mood and Affect: Mood normal.     ED Results / Procedures / Treatments   Labs (all labs ordered are listed, but only  abnormal results are displayed) Labs Reviewed  GROUP A STREP BY PCR  RESP PANEL BY RT-PCR (RSV, FLU A&B, COVID)  [id number]    EKG EKG Interpretation Date/Time:  Monday November 11 2023 23:44:01 EST Ventricular Rate:  64 PR Interval:  184 QRS Duration:  74 QT Interval:  378 QTC Calculation: 389 R Axis:   53  Text Interpretation: Normal sinus rhythm Normal ECG Confirmed by Ernie Avena ([id number]) on 11/12/2023 8:30:00 AM  Radiology DG Chest 2 View  Result Date: 11/12/2023 CLINICAL DATA:  Shortness of breath. EXAM: CHEST - 2 VIEW COMPARISON:  December 14, 2022 FINDINGS: The heart size and mediastinal contours are within normal limits. Both lungs are clear. There is mild, stable dextroscoliosis of the lower thoracic spine and upper lumbar spine. IMPRESSION:  No active cardiopulmonary disease. Electronically Signed   By: Aram Candela M.D.   On: 11/12/2023 04:06    Procedures Procedures    Medications Ordered in ED Medications  oxyCODONE-acetaminophen (PERCOCET/ROXICET) 5-325 MG per tablet 1 tablet (1 tablet Oral Given 11/12/23 0006)  lidocaine (XYLOCAINE) 2 % viscous mouth solution 15 mL (15 mLs Mouth/Throat Given 11/12/23 0006)  ketorolac (TORADOL) injection 30 mg (30 mg Intramuscular Given 11/12/23 0900)  dexamethasone (DECADRON) tablet 8 mg (8 mg Oral Given 11/12/23 7829)    ED Course/ Medical Decision Making/ A&P                                 Medical Decision Making Amount and/or Complexity of Data Reviewed Radiology: ordered.  Risk Prescription drug management.    42 year old female presenting to the emergency department with a sore throat.  Patient states that she has had symptoms of an upper respiratory infection since Saturday.  She states that she has had a laryngitis with loss of voice, pain in her throat bilaterally with no significant unilateral swelling, good range of motion of the neck, feeling subjectively warm but no fevers, mild nonproductive cough. No active chest pain.  He does have a history of spontaneous pneumothorax.  Suzanne Wade is a 42 y.o. female who presents to the ED with a 3 day history of fever, rhinorrhea, and nasal congestion.  On my exam, the patient is well-appearing and well-hydrated.  The patient's lungs are clear to auscultation bilaterally. Additionally, the patient has a soft/non-tender abdomen, clear tympanic membranes, and no oropharyngeal exudates.  There are no signs of meningismus.  I see no signs of an acute bacterial infection.  The patient's presentation is most consistent with a viral upper respiratory infection.  I have a low suspicion for pneumonia as the patient's cough has been non-productive and the patient is neither tachypneic nor hypoxic on room air.  Additionally,  the patient is CTAB.  COVID-19 and influenza PCR testing was collected and resulted negative. Patient strep PCR testing was also negative.  Given the patient's cough and history of pneumothorax with mild discomfort over the weekend, not having active chest pain, low concern for ACS, low concern for PE, PERC negative.  Chest x-ray was performed with no evidence of pneumothorax, no evidence of focal consolidation to suggest bacterial pneumonia.  Patient symptoms likely due to pharyngitis, laryngitis, viral upper respiratory infection.  Given Toradol and Decadron, feeling symptomatically improved, advised continued symptomatic management at home.  Patient has good range of motion of the neck, no unilateral swelling to suggest PTA, low concern for RPA.  Provided return precautions in the event  of any severe worsening symptoms.  I discussed symptomatic management, including hydration, motrin, and tylenol. The patient/family felt safe being discharged from the ED.  They agreed to followup with the PCP if needed.  I provided ED return precautions.    Final Clinical Impression(s) / ED Diagnoses Final diagnoses:  Upper respiratory tract infection, unspecified type  Acute pharyngitis, unspecified etiology  Laryngitis    Rx / DC Orders ED Discharge Orders     None             Ernie Avena, MD 11/12/23 [phone]

## 2023-11-12 NOTE — ED Notes (Signed)
Pt brought back to triage room 5 for re-evaluation by nurse. Pts family advising pts having increased pain and meds that were given earlier seem to have wore off. Pt been here extended period of time.

## 2023-12-12 ENCOUNTER — Encounter: Payer: Self-pay | Admitting: Urgent Care

## 2023-12-12 ENCOUNTER — Ambulatory Visit: Payer: No Typology Code available for payment source | Admitting: Urgent Care

## 2023-12-12 VITALS — BP 106/72 | HR 74 | Temp 98.0°F | Ht 69.0 in | Wt 126.0 lb

## 2023-12-12 DIAGNOSIS — F17210 Nicotine dependence, cigarettes, uncomplicated: Secondary | ICD-10-CM | POA: Diagnosis not present

## 2023-12-12 DIAGNOSIS — F411 Generalized anxiety disorder: Secondary | ICD-10-CM | POA: Diagnosis not present

## 2023-12-12 DIAGNOSIS — Z8709 Personal history of other diseases of the respiratory system: Secondary | ICD-10-CM

## 2023-12-12 DIAGNOSIS — F4310 Post-traumatic stress disorder, unspecified: Secondary | ICD-10-CM | POA: Diagnosis not present

## 2023-12-12 DIAGNOSIS — F410 Panic disorder [episodic paroxysmal anxiety] without agoraphobia: Secondary | ICD-10-CM

## 2023-12-12 MED ORDER — BUSPIRONE HCL 10 MG PO TABS: ORAL_TABLET | ORAL | 2 refills | Status: DC

## 2023-12-12 NOTE — Patient Instructions (Addendum)
Pleasure meeting you today.  I completed your return to work paperwork as requested.  To help with your general stress levels, please start taking Buspirone. You will start a taper with 1/2 tab daily x 5 days. After this, increase to 1/2 tab twice daily x 5 days. Then, take 1 tab in AM, 1/2 tab in PM x 5 days, then continue with one full tab twice daily.  This should help you with smoking as well. Continue to find alternative options to smoking to help with your stress levels.  Herbal or non-pharmacological modalities to help with stress:  -Deep breathing. YES!! It really works. Take four, long, sustained breaths. -Calmi-go... a device that helps you work on calming and anxiety. -Over the counter L-tryptophan. A natural serotonin supplement. Take at night if you decide to try it, can make you sleepy. -Over the counter Boiron stress calm - a natural herbal remedy  Suggestions for therapists. If you need a referral, let me know:  [facility] [address] [place], [place] [address] [phone]   [facility] [phone] [place], [place] [address] [phone]    Please return in ONE MONTH - FASTING for an annual physical.

## 2023-12-12 NOTE — Progress Notes (Signed)
New Patient Office Visit  Subjective:  Patient ID: Suzanne Wade, female    DOB: 09/08/1981  Age: 42 y.o. MRN: [id number]  CC:  Chief Complaint  Patient presents with   Establish Care    New pt est care. ER follow up due to laryngitis. Needs paperwork completed to return to work.    HPI Suzanne Wade presents to establish care.   Pleasant 42yo female presents to establish care. Admits to not having had a PCP in "some time." She primarily presents to have FMLA paperwork completed. She apparently had a severe case of laryngitis starting on 11/11/23 which prompted an ER visit. She works for [employer] and is required to use her voice for 8 hours at a time. Due to her URI illness, she was unable to complete her job duties. She did not regain her full voice back until 11/25/23. She had paperwork completed at an Urgent Care facility, but unfortunately her job did not accept the paperwork due to incomplete details, and needing to be filled out by PCP. Pt has been back to work since 11/25 and just needs this completed.  Additionally, pt reports a hx of severe anxiety which have caused panic attacks. She also reports a hx of PTSD. She is not currently on medication. She states she has been reluctant to take medication in the past due to a hx of suicide attempt 20 years ago with OD on pills. Today, she denies SI/HI. She is managing her stress primarily with intermittent smoking. She however wants to quit. She has been cutting back and is down to three a day. She had been seeing a therapist three times weekly, but unfortunately the therapist just moved out of state last month. Pt is seeking additional therapist recommendations.   Pt does have a hx of spontaneous pneumothorax in the past. She denies any lung complaints today.  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  Pt denies any additional  concerns today and would like to schedule an annual PE in the near future. Pt has 5 children, the youngest is 24 and still lives at home; the other children are grown and out of the house.  Outpatient Encounter Medications as of 12/12/2023  Medication Sig   acetaminophen (TYLENOL) 500 MG tablet Take 1,000 mg by mouth as needed for moderate pain (pain score 4-6) or mild pain (pain score 1-3).   aspirin EC 81 MG tablet Take 81 mg by mouth as needed (chest pain). Swallow whole.   aspirin-acetaminophen-caffeine (EXCEDRIN MIGRAINE) 250-250-65 MG tablet Take 2 tablets by mouth as needed for headache or migraine.   busPIRone (BUSPAR) 10 MG tablet Take 1/2 tab once daily x 5 days, then increase to 1/2 tab twice daily x 5 days, then take one full tab in AM and 1/2 tab in PM x 5 days, then continue on 1 tab twice daily   ferrous sulfate 325 (65 FE) MG tablet Take 325 mg by mouth daily with breakfast.   No facility-administered encounter medications on file as of 12/12/2023.    Past Medical History:  Diagnosis Date   Allergy    Anemia    Anxiety    panic- was out on FMLA   Anxiety    CHF (congestive heart failure) (HCC)    Depression    Heart murmur    Lung collapse    Neuromuscular disorder (HCC)  Panic attacks    Scoliosis    Ulcer     Past Surgical History:  Procedure Laterality Date   CESAREAN SECTION      Family History  Problem Relation Age of Onset   Hypertension Mother    Heart disease Sister    Hyperlipidemia Sister    Hypertension Sister    Cancer Sister        brain   Cancer Maternal Grandmother        uterine   Heart disease Maternal Grandmother    Hyperlipidemia Maternal Grandmother    Hypertension Maternal Grandmother    Stroke Maternal Grandmother    Cancer Maternal Grandfather        lung   Heart disease Maternal Grandfather    Hyperlipidemia Maternal Grandfather    Hypertension Maternal Grandfather    Stroke Maternal Grandfather     Social History    Socioeconomic History   Marital status: Married    Spouse name: Not on file   Number of children: Not on file   Years of education: Not on file   Highest education level: Not on file  Occupational History   Occupation: [employer]/ [employer]  Tobacco Use   Smoking status: Some Days   Smokeless tobacco: Never  Vaping Use   Vaping status: Never Used  Substance and Sexual Activity   Alcohol use: Yes    Alcohol/week: 0.0 standard drinks of alcohol    Comment: 2 drinks   Drug use: No   Sexual activity: Yes    Partners: Female, Female  Other Topics Concern   Not on file  Social History Narrative   ** Merged History Encounter **       ** Data from: 06/03/14 Enc Dept: [facility]       ** Data from: 06/04/14 Enc Dept: [facility]   Married. Education: [employer]. Exercise: yes.   Social Drivers of Corporate investment banker Strain: Not on file  Food Insecurity: Not on file  Transportation Needs: Not on file  Physical Activity: Not on file  Stress: Not on file  Social Connections: Not on file  Intimate Partner Violence: Not on file    ROS: as noted in HPI  Objective:  BP 106/72   Pulse 74   Temp 98 F (36.7 C) (Oral)   Ht 5\' 9"  (1.753 m)   Wt 126 lb (57.2 kg)   SpO2 96%   BMI 18.61 kg/m   Physical Exam Vitals and nursing note reviewed.  Constitutional:      General: She is not in acute distress.    Appearance: Normal appearance. She is not ill-appearing, toxic-appearing or diaphoretic.  HENT:     Head: Normocephalic and atraumatic.     Right Ear: Tympanic membrane, ear canal and external ear normal. There is no impacted cerumen.     Left Ear: Tympanic membrane, ear canal and external ear normal. There is no impacted cerumen.     Nose: Nose normal. No congestion or rhinorrhea.     Mouth/Throat:     Mouth: Mucous membranes are moist.     Pharynx: Oropharynx is clear. No oropharyngeal exudate or posterior oropharyngeal erythema.  Eyes:     General: No  scleral icterus.       Right eye: No discharge.        Left eye: No discharge.     Extraocular Movements: Extraocular movements intact.     Pupils: Pupils are equal, round, and reactive  to light.  Neck:     Thyroid: No thyroid mass, thyromegaly or thyroid tenderness.  Cardiovascular:     Rate and Rhythm: Normal rate and regular rhythm.     Pulses: Normal pulses.     Heart sounds: No murmur heard. Pulmonary:     Effort: Pulmonary effort is normal. No respiratory distress.     Breath sounds: Normal breath sounds. No stridor. No wheezing or rhonchi.  Musculoskeletal:     Cervical back: Normal range of motion and neck supple. No rigidity or tenderness.  Lymphadenopathy:     Cervical: No cervical adenopathy.  Skin:    General: Skin is warm and dry.     Coloration: Skin is not jaundiced.     Findings: No bruising, erythema or rash.     Comments: Post wine stain to L side of face  Neurological:     General: No focal deficit present.     Mental Status: She is alert and oriented to person, place, and time.     Sensory: No sensory deficit.     Motor: No weakness.  Psychiatric:        Mood and Affect: Mood normal.        Behavior: Behavior normal.     Last CBC Lab Results  Component Value Date   WBC 5.7 10/29/2016   HGB 13.4 10/29/2016   HCT 43.1 10/29/2016   MCV 91.5 10/29/2016   MCH 28.5 10/29/2016   RDW 13.8 10/29/2016   PLT 188 10/29/2016   Last metabolic panel Lab Results  Component Value Date   GLUCOSE 79 10/29/2016   NA 139 10/29/2016   K 4.1 10/29/2016   CL 104 10/29/2016   CO2 26 10/29/2016   BUN 8 10/29/2016   CREATININE 0.68 10/29/2016   GFRNONAA >60 03/02/2016   CALCIUM 9.7 10/29/2016   PROT 7.7 10/29/2016   ALBUMIN 4.8 10/29/2016   BILITOT 0.7 10/29/2016   ALKPHOS 50 10/29/2016   AST 25 10/29/2016   ALT 18 10/29/2016   ANIONGAP 7 03/02/2016      Assessment & Plan:  Generalized anxiety disorder with panic attacks Assessment & Plan: Chronic issue  for patient. Has been hesitant for medication in the past. Admits she is willing to try buspirone given its safety profile. -discussed slow taper -additional OTC natural medications discussed -discussed referral to continue therapist - she will look up options and notify where she would like to attend  Orders: -     busPIRone HCl; Take 1/2 tab once daily x 5 days, then increase to 1/2 tab twice daily x 5 days, then take one full tab in AM and 1/2 tab in PM x 5 days, then continue on 1 tab twice daily  Dispense: 60 tablet; Refill: 2  History of pneumothorax Assessment & Plan: Smoking cessation strongly encouraged   Cigarette smoker Assessment & Plan: Pt believes she may have tried wellbutrin in the past. Chantix not a good option for patient Pt smoking to help with her anxiety, therefor will see if we can control anxiety with buspar, thus decreasing patients cravings for cigarettes.  Orders: -     busPIRone HCl; Take 1/2 tab once daily x 5 days, then increase to 1/2 tab twice daily x 5 days, then take one full tab in AM and 1/2 tab in PM x 5 days, then continue on 1 tab twice daily  Dispense: 60 tablet; Refill: 2  PTSD (post-traumatic stress disorder) -     busPIRone HCl; Take  1/2 tab once daily x 5 days, then increase to 1/2 tab twice daily x 5 days, then take one full tab in AM and 1/2 tab in PM x 5 days, then continue on 1 tab twice daily  Dispense: 60 tablet; Refill: 2    Return in about 1 month (around 01/12/2024) for Annual Physical.   Maretta Bees, PA

## 2023-12-12 NOTE — Assessment & Plan Note (Signed)
Chronic issue for patient. Has been hesitant for medication in the past. Admits she is willing to try buspirone given its safety profile. -discussed slow taper -additional OTC natural medications discussed -discussed referral to continue therapist - she will look up options and notify where she would like to attend

## 2023-12-12 NOTE — Assessment & Plan Note (Signed)
Pt believes she may have tried wellbutrin in the past. Chantix not a good option for patient Pt smoking to help with her anxiety, therefor will see if we can control anxiety with buspar, thus decreasing patients cravings for cigarettes.

## 2023-12-12 NOTE — Assessment & Plan Note (Signed)
Smoking cessation strongly encouraged.

## 2024-01-13 ENCOUNTER — Encounter: Payer: Self-pay | Admitting: Urgent Care

## 2024-01-13 ENCOUNTER — Ambulatory Visit (INDEPENDENT_AMBULATORY_CARE_PROVIDER_SITE_OTHER): Payer: No Typology Code available for payment source | Admitting: Urgent Care

## 2024-01-13 VITALS — BP 119/81 | HR 73 | Ht 69.0 in | Wt 133.8 lb

## 2024-01-13 DIAGNOSIS — Z13220 Encounter for screening for lipoid disorders: Secondary | ICD-10-CM | POA: Diagnosis not present

## 2024-01-13 DIAGNOSIS — F410 Panic disorder [episodic paroxysmal anxiety] without agoraphobia: Secondary | ICD-10-CM

## 2024-01-13 DIAGNOSIS — Z131 Encounter for screening for diabetes mellitus: Secondary | ICD-10-CM | POA: Diagnosis not present

## 2024-01-13 DIAGNOSIS — Z0000 Encounter for general adult medical examination without abnormal findings: Secondary | ICD-10-CM

## 2024-01-13 DIAGNOSIS — F411 Generalized anxiety disorder: Secondary | ICD-10-CM | POA: Diagnosis not present

## 2024-01-13 DIAGNOSIS — Z862 Personal history of diseases of the blood and blood-forming organs and certain disorders involving the immune mechanism: Secondary | ICD-10-CM | POA: Diagnosis not present

## 2024-01-13 DIAGNOSIS — Z8709 Personal history of other diseases of the respiratory system: Secondary | ICD-10-CM | POA: Diagnosis not present

## 2024-01-13 DIAGNOSIS — R0981 Nasal congestion: Secondary | ICD-10-CM

## 2024-01-13 DIAGNOSIS — F17210 Nicotine dependence, cigarettes, uncomplicated: Secondary | ICD-10-CM

## 2024-01-13 DIAGNOSIS — F4310 Post-traumatic stress disorder, unspecified: Secondary | ICD-10-CM

## 2024-01-13 DIAGNOSIS — N951 Menopausal and female climacteric states: Secondary | ICD-10-CM

## 2024-01-13 DIAGNOSIS — Z1159 Encounter for screening for other viral diseases: Secondary | ICD-10-CM

## 2024-01-13 DIAGNOSIS — Z114 Encounter for screening for human immunodeficiency virus [HIV]: Secondary | ICD-10-CM

## 2024-01-13 LAB — COMPREHENSIVE METABOLIC PANEL
ALT: 13 U/L (ref 0–35)
AST: 20 U/L (ref 0–37)
Albumin: 4.5 g/dL (ref 3.5–5.2)
Alkaline Phosphatase: 56 U/L (ref 39–117)
BUN: 10 mg/dL (ref 6–23)
CO2: 29 meq/L (ref 19–32)
Calcium: 9.4 mg/dL (ref 8.4–10.5)
Chloride: 105 meq/L (ref 96–112)
Creatinine, Ser: 0.7 mg/dL (ref 0.40–1.20)
GFR: 104.8 mL/min (ref >60.00)
Glucose, Bld: 56 mg/dL — ABNORMAL LOW (ref 70–99)
Potassium: 4.3 meq/L (ref 3.5–5.1)
Sodium: 139 meq/L (ref 135–145)
Total Bilirubin: 1 mg/dL (ref 0.2–1.2)
Total Protein: 7 g/dL (ref 6.0–8.3)

## 2024-01-13 LAB — LIPID PANEL
Cholesterol: 101 mg/dL (ref 0–200)
HDL: 56.3 mg/dL (ref >39.00)
LDL Cholesterol: 33 mg/dL (ref 0–99)
NonHDL: 45.01
Total CHOL/HDL Ratio: 2
Triglycerides: 62 mg/dL (ref 0.0–149.0)
VLDL: 12.4 mg/dL (ref 0.0–40.0)

## 2024-01-13 LAB — CBC
HCT: 39.8 % (ref 36.0–46.0)
Hemoglobin: 12.8 g/dL (ref 12.0–15.0)
MCHC: 32.2 g/dL (ref 30.0–36.0)
MCV: 89.3 fL (ref 78.0–100.0)
Platelets: 236 10*3/uL (ref 150.0–400.0)
RBC: 4.46 Mil/uL (ref 3.87–5.11)
RDW: 13.9 % (ref 11.5–15.5)
WBC: 7.5 10*3/uL (ref 4.0–10.5)

## 2024-01-13 LAB — HEMOGLOBIN A1C: Hgb A1c MFr Bld: 5.3 % (ref 4.6–6.5)

## 2024-01-13 LAB — TSH: TSH: 1 u[IU]/mL (ref 0.35–5.50)

## 2024-01-13 MED ORDER — FLUTICASONE PROPIONATE 50 MCG/ACT NA SUSP: 2.0000 | Freq: Every day | NASAL | 6 refills | Status: AC

## 2024-01-13 MED ORDER — BUSPIRONE HCL 10 MG PO TABS: ORAL_TABLET | ORAL | 3 refills | Status: AC

## 2024-01-13 NOTE — Progress Notes (Signed)
 Annual Wellness Visit     Patient: Suzanne Wade, Female    DOB: 07-17-1981, 43 y.o.   MRN: [id number]  Subjective  Chief Complaint  Patient presents with   Annual Exam    Cpe pt is not fasting. Pt does have a GYN for paps    Discussed the use of AI software for clinical note transcription with the patient who gave verbal consent to proceed.   Suzanne Wade is a 43 y.o. female who presents today for her Annual Wellness Visit. She reports consuming a general diet. Home exercise routine includes exercise ball, stretching, walking 0.5 hrs per days, and weights. She generally feels fairly well. She reports sleeping well. She does have additional problems to discuss today.   History of Present Illness The patient, a 43 year old individual with a history of anemia, scoliosis, and a previous spontaneous pneumothorax, presents for an annual physical. She reports a recent increase in weight from 118 to 133 pounds, achieved through a high-protein diet and regular exercise. Despite this, she expresses a desire to gain an additional 5-7 pounds for a target weight of 135-140 pounds.  The patient engages in daily exercise, including both cardio and weightlifting, and reports a high level of physical activity in her daily routine. She has a history of muscle failure in her arms, which she manages with regular strength training.  She reports a fair overall health status. She has not had a menstrual period in approximately 9-10 months and is experiencing hot flashes, which she describes as feeling like someone's poured lava all over me. These episodes are severe enough to cause sweating and near fainting.  The patient has a history of smoking and is currently attempting to quit with the aid of Buspirone , which she reports has helped reduce her daily cigarette consumption. She expresses a desire to completely quit smoking and has noticed some improvement since starting the  medication.  She also reports nasal congestion and a history of sinusitis, which she manages with a humidifier and a prescription similar to Flonase . She has not been experiencing any current dental issues, but reports a broken vampire fang that requires removal.  The patient has a family history of cancer, including lung and uterine cancer. She has been following up with a gynecologist for regular Pap smears and plans to continue doing so. She also reports a history of anemia and has been managing this condition with regular monitoring of her iron levels.    Vision:Within last year and Dental: No current dental problems and Receives regular dental care   Patient Active Problem List   Diagnosis Date Noted   History of pneumothorax 12/12/2023   Cigarette smoker 12/12/2023   Recurrent sinusitis 01/23/2019   Acute pain of left shoulder 08/01/2017   Sprain of shoulder, left 08/01/2017   Syncope and collapse 02/22/2014   Generalized anxiety disorder with panic attacks 02/22/2014   Past Medical History:  Diagnosis Date   Allergy    Anemia    Anxiety    panic- was out on FMLA   Anxiety    CHF (congestive heart failure) (HCC)    Depression    Heart murmur    Lung collapse    Neuromuscular disorder (HCC)    Panic attacks    Scoliosis    Ulcer    Past Surgical History:  Procedure Laterality Date   CESAREAN SECTION     Social History   Tobacco Use   Smoking status: Some Days  Smokeless tobacco: Never  Vaping Use   Vaping status: Never Used  Substance Use Topics   Alcohol use: Yes    Alcohol/week: 0.0 standard drinks of alcohol    Comment: 2 drinks   Drug use: No      Medications: Outpatient Medications Prior to Visit  Medication Sig   acetaminophen  (TYLENOL ) 500 MG tablet Take 1,000 mg by mouth as needed for moderate pain (pain score 4-6) or mild pain (pain score 1-3).   aspirin EC 81 MG tablet Take 81 mg by mouth as needed (chest pain). Swallow whole.    aspirin-acetaminophen -caffeine (EXCEDRIN MIGRAINE) 250-250-65 MG tablet Take 2 tablets by mouth as needed for headache or migraine.   ferrous sulfate 325 (65 FE) MG tablet Take 325 mg by mouth daily with breakfast.   [DISCONTINUED] busPIRone  (BUSPAR ) 10 MG tablet Take 1/2 tab once daily x 5 days, then increase to 1/2 tab twice daily x 5 days, then take one full tab in AM and 1/2 tab in PM x 5 days, then continue on 1 tab twice daily   No facility-administered medications prior to visit.    Allergies  Allergen Reactions   Azithromycin Anaphylaxis   Nickel Rash    Breaks out, red rash    Patient Care Team: Lowella Benton [place], [place] as PCP - General (Physician Assistant)  ROS Complete 12 point review of systems completed with all pertinent positives listed in HPI     Objective  BP 119/81   Pulse 73   Ht 5' 9 (1.753 m)   Wt 133 lb 12.8 oz (60.7 kg)   SpO2 100%   BMI 19.76 kg/m  BP Readings from Last 3 Encounters:  01/13/24 119/81  12/12/23 106/72  11/12/23 109/84   Wt Readings from Last 3 Encounters:  01/13/24 133 lb 12.8 oz (60.7 kg)  12/12/23 126 lb (57.2 kg)  11/11/23 120 lb 2.4 oz (54.5 kg)      Physical Exam Vitals and nursing note reviewed.  Constitutional:      General: She is not in acute distress.    Appearance: Normal appearance. She is not ill-appearing, toxic-appearing or diaphoretic.  HENT:     Head: Normocephalic and atraumatic.     Right Ear: Tympanic membrane, ear canal and external ear normal. There is no impacted cerumen.     Left Ear: Tympanic membrane, ear canal and external ear normal. There is no impacted cerumen.     Nose: Congestion and rhinorrhea present.     Right Turbinates: Enlarged and swollen.     Left Turbinates: Enlarged and swollen.     Right Sinus: No maxillary sinus tenderness or frontal sinus tenderness.     Left Sinus: No maxillary sinus tenderness or frontal sinus tenderness.     Mouth/Throat:     Mouth: Mucous membranes are moist.      Pharynx: Oropharynx is clear. No oropharyngeal exudate or posterior oropharyngeal erythema.  Eyes:     General: No scleral icterus.       Right eye: No discharge.        Left eye: No discharge.     Extraocular Movements: Extraocular movements intact.     Pupils: Pupils are equal, round, and reactive to light.  Neck:     Thyroid : No thyroid  mass, thyromegaly or thyroid  tenderness.  Cardiovascular:     Rate and Rhythm: Normal rate and regular rhythm.     Pulses: Normal pulses.     Heart sounds: No murmur heard. Pulmonary:  Effort: Pulmonary effort is normal. No respiratory distress.     Breath sounds: Normal breath sounds. No stridor. No wheezing or rhonchi.  Abdominal:     General: Abdomen is flat. Bowel sounds are normal. There is no distension.     Palpations: Abdomen is soft. There is no mass.     Tenderness: There is no abdominal tenderness. There is no guarding.  Musculoskeletal:     Cervical back: Normal range of motion and neck supple. No rigidity or tenderness.     Right lower leg: No edema.     Left lower leg: No edema.  Lymphadenopathy:     Cervical: No cervical adenopathy.  Skin:    General: Skin is warm and dry.     Coloration: Skin is not jaundiced.     Findings: No bruising, erythema or rash.     Comments: Port wine stain to L side of face  Neurological:     General: No focal deficit present.     Mental Status: She is alert and oriented to person, place, and time.     Sensory: No sensory deficit.     Motor: No weakness.  Psychiatric:        Mood and Affect: Mood normal.        Behavior: Behavior normal.       Most recent functional status assessment:     No data to display         Most recent fall risk assessment:    12/12/2023    2:14 PM  Fall Risk   Falls in the past year? 0  Number falls in past yr: 0  Injury with Fall? 0  Risk for fall due to : No Fall Risks  Follow up Falls evaluation completed    Most recent depression  screenings:    12/12/2023    2:14 PM 08/10/2020    2:02 PM  PHQ 2/9 Scores  PHQ - 2 Score 1 0  PHQ- 9 Score 4    Most recent cognitive screening:     No data to display         Most recent Audit-C alcohol use screening     No data to display         A score of 3 or more in women, and 4 or more in men indicates increased risk for alcohol abuse, EXCEPT if all of the points are from question 1   Vision/Hearing Screen: No results found.  Last CBC Lab Results  Component Value Date   WBC 5.7 10/29/2016   HGB 13.4 10/29/2016   HCT 43.1 10/29/2016   MCV 91.5 10/29/2016   MCH 28.5 10/29/2016   RDW 13.8 10/29/2016   PLT 188 10/29/2016   Last metabolic panel Lab Results  Component Value Date   GLUCOSE 79 10/29/2016   NA 139 10/29/2016   K 4.1 10/29/2016   CL 104 10/29/2016   CO2 26 10/29/2016   BUN 8 10/29/2016   CREATININE 0.68 10/29/2016   GFRNONAA >60 03/02/2016   CALCIUM 9.7 10/29/2016   PROT 7.7 10/29/2016   ALBUMIN 4.8 10/29/2016   BILITOT 0.7 10/29/2016   ALKPHOS 50 10/29/2016   AST 25 10/29/2016   ALT 18 10/29/2016   ANIONGAP 7 03/02/2016   Last lipids Lab Results  Component Value Date   CHOL 99 (L) 10/29/2016   HDL 50 10/29/2016   LDLCALC 37 10/29/2016   TRIG 59 10/29/2016   CHOLHDL 2.0 10/29/2016   Last hemoglobin A1c  No results found for: HGBA1C    No results found for any visits on 01/13/24.    Assessment & Plan   Annual wellness visit done today including the all of the following: Reviewed patient's Family Medical History Reviewed and updated list of patient's medical providers Assessment of cognitive impairment was done Assessed patient's functional ability Established a written schedule for health screening services Health Risk Assessent Completed and Reviewed  Exercise Activities and Dietary recommendations  Goals   None     Immunization History  Administered Date(s) Administered   Influenza,inj,Quad PF,6+ Mos  10/27/2016   Influenza-Unspecified 09/29/2013   PFIZER(Purple Top)SARS-COV-2 Vaccination 09/13/2020, 10/04/2020    Health Maintenance  Topic Date Due   Pneumococcal Vaccine 56-34 Years old (1 of 2 - PCV) Never done   HIV Screening  Never done   Hepatitis C Screening  Never done   DTaP/Tdap/Td (1 - Tdap) Never done   Cervical Cancer Screening (HPV/Pap Cotest)  02/13/2024 (Originally 02/03/2009)   INFLUENZA VACCINE  03/30/2024 (Originally 08/01/2023)   COVID-19 Vaccine (3 - 2024-25 season) 12/25/2024 (Originally 09/01/2023)   HPV VACCINES  Aged Out     Discussed health benefits of physical activity, and encouraged her to engage in regular exercise appropriate for her age and condition.    Problem List Items Addressed This Visit       Other   Generalized anxiety disorder with panic attacks   Relevant Medications   busPIRone  (BUSPAR ) 10 MG tablet   History of pneumothorax   Cigarette smoker   Relevant Medications   busPIRone  (BUSPAR ) 10 MG tablet   Other Visit Diagnoses       Adult wellness visit    -  Primary   Relevant Orders   CBC   Comprehensive metabolic panel   Hemoglobin A1c   Lipid panel   TSH   Iron, TIBC and Ferritin Panel     Lipid screening       Relevant Orders   Lipid panel     Diabetes mellitus screening       Relevant Orders   Hemoglobin A1c     History of anemia       Relevant Orders   CBC   Iron, TIBC and Ferritin Panel     Need for hepatitis C screening test       Relevant Orders   Hepatitis C Antibody     Encounter for screening for HIV       Relevant Orders   HIV antibody (with reflex)     PTSD (post-traumatic stress disorder)       Relevant Medications   busPIRone  (BUSPAR ) 10 MG tablet     Nasal congestion       Relevant Medications   fluticasone  (FLONASE ) 50 MCG/ACT nasal spray     Perimenopause         Weight Gain Patient has been trying to gain weight and has been successful in gaining some weight. Currently at 133 lbs, goal weight is  135-140 lbs. -Continue current diet and exercise regimen.  Scoliosis Patient reports having scoliosis and experiences discomfort due to sitting for long periods at work. -Continue current exercise regimen and use of supportive seating.  Anemia Patient reports a history of anemia. No current symptoms reported. -Order iron panel to assess current status.  Perimenopause Patient reports hot flashes and has not had a menstrual period in 9-10 months. -Try over-the-counter remedies such as black cohosh and evening primrose oil. If symptoms persist, consider reaching out  for additional treatment options.  Nasal Congestion Patient reports chronic nasal congestion. -Resume Flonase  and add saline spray to regimen.  General Health Maintenance -Continue Buspirone  10mg  twice daily for anxiety. -Update Pap smear with gynecologist. -Update HIV and Hepatitis C screening. -Postpone pneumococcal vaccine until resolution of nasal congestion.  Return in about 1 year (around 01/12/2025) for Annual Physical.     Benton LITTIE Gave, PA

## 2024-01-13 NOTE — Patient Instructions (Addendum)
 You had your annual physical completed today. Labs updated.  Please follow up with gynecology as discussed. Have all pap smear and mammogram results sent to me.   I have refilled buspirone  - continue taking 10mg  twice daily. Continue the hard work on cutting back on smoking!! Great job!!  I have called in flonase  to help with your nasal congestion - use 1-2 sprays twice daily. Also use steam/ humidification to help open your sinus passages and use saline lavages.  For the hot flashes - please try OTC black cohosh and/or evening primrose oil.

## 2024-01-14 LAB — IRON,TIBC AND FERRITIN PANEL
%SAT: 26 % (ref 16–45)
Ferritin: 125 ng/mL (ref 16–232)
Iron: 89 ug/dL (ref 40–190)
TIBC: 340 ug/dL (ref 250–450)

## 2024-01-14 LAB — HIV ANTIBODY (ROUTINE TESTING W REFLEX): HIV 1&2 Ab, 4th Generation: NONREACTIVE

## 2024-01-14 LAB — HEPATITIS C ANTIBODY: Hepatitis C Ab: NONREACTIVE
